# Patient Record
Sex: Male | Born: 1965 | Race: Black or African American | Hispanic: No | Marital: Single | Smoking: Former smoker
Health system: Southern US, Community
[De-identification: ages and names within clinical notes are randomized; demographics above are authoritative.]

## PROBLEM LIST (undated history)

## (undated) DIAGNOSIS — I10 Essential (primary) hypertension: Secondary | ICD-10-CM

## (undated) DIAGNOSIS — E785 Hyperlipidemia, unspecified: Secondary | ICD-10-CM

## (undated) HISTORY — DX: Hyperlipidemia, unspecified: E78.5

## (undated) HISTORY — DX: Essential (primary) hypertension: I10

## (undated) HISTORY — DX: Morbid (severe) obesity due to excess calories: E66.01

## (undated) HISTORY — PX: MASTECTOMY: SHX3

---

## 1998-02-01 ENCOUNTER — Emergency Department (HOSPITAL_COMMUNITY): Admission: EM | Admit: 1998-02-01 | Discharge: 1998-02-02 | Payer: Self-pay | Admitting: Emergency Medicine

## 2000-10-25 ENCOUNTER — Emergency Department (HOSPITAL_COMMUNITY): Admission: EM | Admit: 2000-10-25 | Discharge: 2000-10-25 | Payer: Self-pay | Admitting: Emergency Medicine

## 2000-10-25 ENCOUNTER — Encounter: Payer: Self-pay | Admitting: Emergency Medicine

## 2000-10-31 ENCOUNTER — Ambulatory Visit (HOSPITAL_COMMUNITY): Admission: RE | Admit: 2000-10-31 | Discharge: 2000-10-31 | Payer: Self-pay | Admitting: Orthopedic Surgery

## 2000-10-31 ENCOUNTER — Encounter: Payer: Self-pay | Admitting: Orthopedic Surgery

## 2002-08-07 ENCOUNTER — Emergency Department (HOSPITAL_COMMUNITY): Admission: EM | Admit: 2002-08-07 | Discharge: 2002-08-07 | Payer: Self-pay | Admitting: Emergency Medicine

## 2005-06-13 ENCOUNTER — Emergency Department (HOSPITAL_COMMUNITY): Admission: EM | Admit: 2005-06-13 | Discharge: 2005-06-13 | Payer: Self-pay | Admitting: Emergency Medicine

## 2010-01-13 ENCOUNTER — Encounter: Payer: Self-pay | Admitting: Internal Medicine

## 2010-02-02 ENCOUNTER — Encounter: Payer: Self-pay | Admitting: Internal Medicine

## 2010-03-15 DIAGNOSIS — R9431 Abnormal electrocardiogram [ECG] [EKG]: Secondary | ICD-10-CM

## 2010-03-16 ENCOUNTER — Ambulatory Visit: Payer: Self-pay | Admitting: Internal Medicine

## 2010-03-16 ENCOUNTER — Encounter: Payer: Self-pay | Admitting: Internal Medicine

## 2010-03-16 ENCOUNTER — Telehealth (INDEPENDENT_AMBULATORY_CARE_PROVIDER_SITE_OTHER): Payer: Self-pay | Admitting: *Deleted

## 2010-03-16 DIAGNOSIS — R0609 Other forms of dyspnea: Secondary | ICD-10-CM

## 2010-03-16 DIAGNOSIS — R072 Precordial pain: Secondary | ICD-10-CM | POA: Insufficient documentation

## 2010-03-16 DIAGNOSIS — E785 Hyperlipidemia, unspecified: Secondary | ICD-10-CM

## 2010-03-16 DIAGNOSIS — R0989 Other specified symptoms and signs involving the circulatory and respiratory systems: Secondary | ICD-10-CM | POA: Insufficient documentation

## 2010-03-16 DIAGNOSIS — F17200 Nicotine dependence, unspecified, uncomplicated: Secondary | ICD-10-CM

## 2010-03-16 DIAGNOSIS — R0789 Other chest pain: Secondary | ICD-10-CM | POA: Insufficient documentation

## 2010-03-16 DIAGNOSIS — E783 Hyperchylomicronemia: Secondary | ICD-10-CM

## 2010-03-16 DIAGNOSIS — I10 Essential (primary) hypertension: Secondary | ICD-10-CM

## 2010-04-13 ENCOUNTER — Ambulatory Visit: Payer: Self-pay | Admitting: Internal Medicine

## 2010-04-14 ENCOUNTER — Encounter: Payer: Self-pay | Admitting: Internal Medicine

## 2010-06-14 NOTE — Letter (Signed)
Summary: [facility]   Imported By: Marylou Mccoy 03/18/2010 09:29:58  _____________________________________________________________________  External Attachment:    Type:   Image     Comment:   External Document

## 2010-06-14 NOTE — Progress Notes (Signed)
  ROI faxed to [facility] @ [phone] [facility] @ [phone]. Charles Bush  March 16, 2010 1:41 PM      Appended Document:  Records received from [facility] gace to [place]   Appended Document:  Records received from [facility] gave to [place] ( covering [address])

## 2010-06-14 NOTE — Letter (Signed)
Summary: Generic Letter  [facility], Main Office  [address]   [place], [place] [address]   Phone: [phone]  Fax: [phone]        April 14, 2010 MRN: [id number]    Charles Bush [address] [place], [place]  [address]    Dear Charles Bush,  Our records indicate that you have cancelled your stress test.  It is Dr Bensimhon's recommendation that you have this test done.  Please contact our office as soon as possible to reschedule.     Sincerely,  Meredith Staggers, RN Arvilla Meres, MD  This letter has been electronically signed by your physician.

## 2010-06-14 NOTE — Assessment & Plan Note (Signed)
Summary: np6/abnormal ekg/mt   Visit Type:  new pt visit Referring Provider:  Dr. Fredderick Severance Primary Provider:  Dr. Fredderick Severance  CC:  pt here for abnormal EKG....sob pt states he is  heavy smoker....denies any edema or chest discomfort.  History of Present Illness: 45 y/o male (husband of [name], technical sales in [employer]) with morbid obesity, tobacco, HTN,  HL referred by PCP for abnormal ECG.   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 Takes elevator if has to go up steps. Denies chest pain but if he does a lot of activity may get some chest pressure. Moderate dyspnea. + occasional wheezing. No edema. Not exercising regularly.  Snores all night. Frequently stops breathing. Never been worked for sleep apnea.  PCP recently checked and was told levels were really high. Not started on anything. Denies diabetes or erectile dysfunction.  Smoking 1 PPD x 26 years.  Preventive Screening-Counseling & Management  Alcohol-Tobacco     Smoking Status: current  Caffeine-Diet-Exercise     Does Patient Exercise: no      Drug Use:  no.    Current Medications (verified): 1)  Amlodipine Besylate 5 Mg Tabs (Amlodipine Besylate) .Marland Kitchen.. 1 Tab Once Daily 2)  Chlorthalidone 25 Mg Tabs (Chlorthalidone) .Marland Kitchen.. 1 Tab Once Daily  Allergies (verified): No Known Drug Allergies  Past History:  Family History: Last updated: 03/16/2010 M:  h/o asthma,  s/p MI F: remission for blood cancer. no heart disease Sister: doing ok   Social History: Last updated: 03/16/2010 Full Time Married ..wife is Diplomatic Services operational officer @ [employer] Tobacco Use - Yes. 1 ppd Regular Exercise - no Drug Use - no  Risk Factors: Exercise: no (03/16/2010)  Risk Factors: Smoking Status: current (03/16/2010)  Past Medical History: Morbid Obesity HTN Hyperlipidemia Tobacco user  Past Surgical History: mastectomy  Family History: M:  h/o asthma,  s/p MI F: remission for blood cancer. no heart disease Sister: doing ok   Social  History: Full Time Married ..wife is Diplomatic Services operational officer @ [employer] Tobacco Use - Yes. 1 ppd Regular Exercise - no Drug Use - no Smoking Status:  current Does Patient Exercise:  no Drug Use:  no  Review of Systems       As per HPI and past medical history; otherwise all systems negative.   Vital Signs:  Patient profile:   44 year old male Height:      67 inches Weight:      264.8 pounds BMI:     41.62 Pulse rate:   68 / minute Pulse rhythm:   regular BP sitting:   126 / 74  (left arm) Cuff size:   large  Vitals Entered By: Danielle Rankin, CMA (March 16, 2010 9:46 AM)  Physical Exam  General:  Well appearing. no resp difficulty HEENT: normal except for poor dentition Neck: supple. no JVD. Carotids 2+ bilat; no bruits. No lymphadenopathy or thryomegaly appreciated. Cor: PMI nondisplaced. Regular rate & rhythm. No rubs, gallops, murmur. Lungs: clear Abdomen: obese soft, nontender, nondistended. No hepatosplenomegaly. No bruits or masses. Good bowel sounds. Extremities: no cyanosis, clubbing, rash, edema Neuro: alert & orientedx3, cranial nerves grossly intact. moves all 4 extremities w/o difficulty. affect pleasant    Problems:  Medical Problems Added: 1)  Dx of Tobacco User  (ICD-305.1) 2)  Dx of Snoring  (ICD-786.09) 3)  Dx of Hypertension, Benign  (ICD-401.1) 4)  Dx of Hyperlipidemia Type I / Iv  (ICD-272.3) 5)  Dx of Obesity-morbid (>100')  (ICD-278.01) 6)  Dx  of Chest Tightness-pressure-other  (ICD-786.59) 7)  Dx of Chest Pain, Precordial  (ICD-786.51) 8)  Dx of Hyperlipidemia  (ICD-272.4) 9)  Dx of Dyspnea On Exertion  (ICD-786.09)  Impression & Recommendations:  Problem # 1:  CHEST TIGHTNESS-PRESSURE-OTHER (AOZ-308.65) He has multiple risk factors for CAD. We initially discussed enrollment into Promise Trial (stress test vs cardiac CT) but he does not qualify based on age. Will proceed with ETT. Start ECASA 81qd   Problem # 2:  HYPERTENSION, BENIGN (ICD-401.1) Blood  pressure well controlled. Continue current regimen.  Problem # 3:  HYPERLIPIDEMIA TYPE I / IV (ICD-272.3) Will get results from PCP and treat accordingly.  Problem # 4:  SNORING (ICD-786.09) Suspect he has severe OSA. Discuseed cardiac risks of this. Will start with attempts for diet and exercise.  Problem # 5:  TOBACCO USER (ICD-305.1) Counseled on need to quit. Will start Chantix.  Problem # 6:  OBESITY-MORBID (>100') (ICD-278.01) Have recommended he start on a walking progra, 20-40 mins 4-5x/week for starters. Also begin to focus on diet. Stressed to him that becoming more active is first focus and we can work ona ctual weight loss later.  Other Orders: Treadmill (Treadmill)  Patient Instructions: 1)  Chantix Starter Kit--take as directed 2)  Your physician discussed the risks, benefits and indications for preventive aspirin therapy. It is recommended that you start (or continue) taking 81 mg of aspirin a day. 3)  Your physician has requested that you have an exercise tolerance test.  For further information please visit [email or web address]  Please also follow instruction sheet, as given. 4)  Follow up in 3 months. Prescriptions: CHANTIX STARTING MONTH PAK 0.5 MG X 11 & 1 MG X 42 TABS (VARENICLINE TARTRATE) Take as directed  #1 pack x 0   Entered by:   Meredith Staggers, RN   Authorized by:   Dolores Patty, MD, [facility]   Signed by:   Meredith Staggers, RN on 03/16/2010   Method used:   [facility]  [address] [phone]* (retail)       [address]       [place], [place]  [address]       Ph: [phone] or [phone]       Fax: [phone]   RxID:   [id number]   Prevention & Chronic Care Immunizations   Influenza vaccine: Not documented    Tetanus booster: Not documented    Pneumococcal vaccine: Not documented  Other Screening   Smoking status: current  (03/16/2010)  Lipids   Total Cholesterol: Not documented   LDL: Not  documented   LDL Direct: Not documented   HDL: Not documented   Triglycerides: Not documented    SGOT (AST): Not documented   SGPT (ALT): Not documented   Alkaline phosphatase: Not documented   Total bilirubin: Not documented  Hypertension   Last Blood Pressure: 126 / 74  (03/16/2010)   Serum creatinine: Not documented   Serum potassium Not documented  Self-Management Support :    Hypertension self-management support: Not documented    Lipid self-management support: Not documented

## 2010-06-29 ENCOUNTER — Ambulatory Visit: Payer: Self-pay | Admitting: Internal Medicine

## 2010-07-21 ENCOUNTER — Encounter (INDEPENDENT_AMBULATORY_CARE_PROVIDER_SITE_OTHER): Payer: Self-pay | Admitting: *Deleted

## 2010-07-26 ENCOUNTER — Encounter: Payer: Self-pay | Admitting: Internal Medicine

## 2010-07-26 NOTE — Letter (Signed)
Summary: Appointment - Reschedule  [employer], Main Office  [address]   [place], [place] [address]   Phone: [phone]  Fax: [phone]     July 21, 2010 MRN: [id number]   Charles Bush [address] [place], [place]  [address]   Dear [name],   Due to a change in our office schedule, your appointment on  March 27,2012 at 10:45 must be changed.  It is very important that we reach you to reschedule this appointment. We look forward to participating in your health care needs. Please contact us at the number listed above at your earliest convenience to reschedule this appointment.     Sincerely, Control and instrumentation engineer

## 2010-08-09 ENCOUNTER — Ambulatory Visit: Payer: Self-pay | Admitting: Internal Medicine

## 2010-08-11 NOTE — Letter (Signed)
Summary: [facility] Office Visit Note   [facility] Visit Note   Imported By: Roderic Ovens 08/02/2010 10:46:04  _____________________________________________________________________  External Attachment:    Type:   Image     Comment:   External Document

## 2010-08-23 ENCOUNTER — Ambulatory Visit (INDEPENDENT_AMBULATORY_CARE_PROVIDER_SITE_OTHER): Payer: Self-pay | Admitting: Internal Medicine

## 2010-08-23 ENCOUNTER — Encounter: Payer: Self-pay | Admitting: Internal Medicine

## 2010-08-23 VITALS — BP 148/88 | HR 67 | Resp 18 | Ht 67.0 in | Wt 266.8 lb

## 2010-08-23 DIAGNOSIS — F17200 Nicotine dependence, unspecified, uncomplicated: Secondary | ICD-10-CM

## 2010-08-23 DIAGNOSIS — N529 Male erectile dysfunction, unspecified: Secondary | ICD-10-CM

## 2010-08-23 DIAGNOSIS — R0789 Other chest pain: Secondary | ICD-10-CM

## 2010-08-23 DIAGNOSIS — I10 Essential (primary) hypertension: Secondary | ICD-10-CM

## 2010-08-23 DIAGNOSIS — E785 Hyperlipidemia, unspecified: Secondary | ICD-10-CM

## 2010-08-23 MED ORDER — SILDENAFIL CITRATE 100 MG PO TABS: 100.0000 mg | ORAL_TABLET | Freq: Every day | ORAL | Status: AC | PRN

## 2010-08-23 MED ORDER — SPIRONOLACTONE 25 MG PO TABS: 25.0000 mg | ORAL_TABLET | Freq: Every day | ORAL | Status: DC

## 2010-08-23 MED ORDER — AMLODIPINE BESYLATE 5 MG PO TABS: 10.0000 mg | ORAL_TABLET | Freq: Every day | ORAL | Status: DC

## 2010-08-23 NOTE — Progress Notes (Signed)
HPI:  Charles Bush is 45 y/o male (husband of Charles Bush, technical sales in [employer]) with morbid obesity, ongoing tobacco use, HTN,  HL referred by PCP for abnormal ECG.   We saw him for the first time for the first time in November 2011. Recommended ETT, Chantix for smoking cessation and weight loss to help deal with probable OSA. Unable to tolerate Chantix due to side effects. Has not lost weight. Did not get ETT due to finances.   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 Takes elevator if has to go up steps. Denies any further chest pain. Moderate dyspnea. + occasional wheezing. No edema. Not exercising regularly.  Snores at night. Frequently stops breathing. Never been worked for sleep apnea.  PCP checked lipids last year and told it was really high. Not checked again.   ROS: All systems negative except as listed in HPI, PMH and Problem List.  Past Medical History  Diagnosis Date  . Morbid obesity   . HTN (hypertension)   . HLD (hyperlipidemia)     Current Outpatient Prescriptions  Medication Sig Dispense Refill  . amLODipine (NORVASC) 5 MG tablet Take 5 mg by mouth daily.       . chlorthalidone (HYGROTON) 25 MG tablet Take 25 mg by mouth daily.        . Varenicline Tartrate (CHANTIX PO) Take by mouth as directed.           PHYSICAL EXAM: Filed Vitals:   08/23/10 1046  BP: 148/88  Pulse: 67  Resp: 18   General:  Obese. Well appearing. No resp difficulty HEENT: normal Neck: supple. JVP hard to see. Carotids 2+ bilaterally; no bruits. No lymphadenopathy or thryomegaly appreciated. Cor: PMI normal. Regular rate & rhythm. No rubs, gallops or murmurs. Lungs: clear Abdomen: obese. soft, nontender, nondistended. No hepatosplenomegaly. No bruits or masses. Good bowel sounds. Extremities: no cyanosis, clubbing, rash, tr-1+ edema Neuro: alert & orientedx3, cranial nerves grossly intact. Moves all 4 extremities w/o difficulty. Affect pleasant.    ECG: NSR 69 No ST-T wave  abnormalities.     ASSESSMENT & PLAN:

## 2010-08-23 NOTE — Patient Instructions (Addendum)
Increase Amlodipine to 10mg  daily Start Spironolactone 25mg  daily Labs today Your physician recommends that you return for lab work in: 1 week (BMET 401.1) Your physician discussed the hazards of tobacco use. Tobacco use cessation is recommended and techniques and options to help you quit were discussed. Your physician recommends that you schedule a follow-up appointment in: 6 weeks with Dr Gala Romney

## 2010-08-27 DIAGNOSIS — N529 Male erectile dysfunction, unspecified: Secondary | ICD-10-CM | POA: Insufficient documentation

## 2010-08-27 NOTE — Assessment & Plan Note (Signed)
Given prescription for trial of PRN sildenafil.

## 2010-08-27 NOTE — Assessment & Plan Note (Signed)
Suboptimally controlled. Increase amlodipine to 10 qd and change chlorithalidone to spironolactone 12.5. Check labs in 1 week. F/u with PCP.

## 2010-08-27 NOTE — Assessment & Plan Note (Signed)
Counseled on need for diet and weight loss.

## 2010-08-27 NOTE — Assessment & Plan Note (Signed)
Resolved. Did not show for scheduled stress test. If CPf recurs, would have very low threshold for further testing.

## 2010-08-27 NOTE — Assessment & Plan Note (Signed)
Has not had lipids checked in a long time. Due for a recheck. Stressed need for diet and weight loss.

## 2010-08-27 NOTE — Assessment & Plan Note (Signed)
Counseled on need for smoking cessation.  

## 2010-08-30 ENCOUNTER — Other Ambulatory Visit (INDEPENDENT_AMBULATORY_CARE_PROVIDER_SITE_OTHER): Payer: Self-pay | Admitting: *Deleted

## 2010-08-30 DIAGNOSIS — I10 Essential (primary) hypertension: Secondary | ICD-10-CM

## 2010-08-30 LAB — BASIC METABOLIC PANEL
BUN: 22 mg/dL (ref 6–23)
GFR: 106.47 mL/min (ref >60.00)
Potassium: 4.5 mEq/L (ref 3.5–5.1)
Sodium: 140 mEq/L (ref 135–145)

## 2010-09-28 ENCOUNTER — Ambulatory Visit (INDEPENDENT_AMBULATORY_CARE_PROVIDER_SITE_OTHER): Payer: BC Managed Care – PPO | Admitting: Internal Medicine

## 2010-09-28 ENCOUNTER — Ambulatory Visit: Payer: Self-pay | Admitting: Internal Medicine

## 2010-09-28 ENCOUNTER — Encounter: Payer: Self-pay | Admitting: Internal Medicine

## 2010-09-28 VITALS — BP 150/88 | HR 60 | Resp 14 | Ht 67.0 in | Wt 272.0 lb

## 2010-09-28 DIAGNOSIS — R5383 Other fatigue: Secondary | ICD-10-CM

## 2010-09-28 DIAGNOSIS — E785 Hyperlipidemia, unspecified: Secondary | ICD-10-CM

## 2010-09-28 DIAGNOSIS — R0609 Other forms of dyspnea: Secondary | ICD-10-CM

## 2010-09-28 DIAGNOSIS — E781 Pure hyperglyceridemia: Secondary | ICD-10-CM

## 2010-09-28 DIAGNOSIS — R0683 Snoring: Secondary | ICD-10-CM

## 2010-09-28 DIAGNOSIS — I10 Essential (primary) hypertension: Secondary | ICD-10-CM

## 2010-09-28 LAB — LIPID PANEL
HDL: 37.9 mg/dL — ABNORMAL LOW (ref >39.00)
Total CHOL/HDL Ratio: 4

## 2010-09-28 LAB — HEPATIC FUNCTION PANEL
ALT: 16 U/L (ref 0–53)
AST: 16 U/L (ref 0–37)
Alkaline Phosphatase: 67 U/L (ref 39–117)
Bilirubin, Direct: 0.1 mg/dL (ref 0.0–0.3)
Total Bilirubin: 0.4 mg/dL (ref 0.3–1.2)

## 2010-09-28 MED ORDER — HYDROCHLOROTHIAZIDE 12.5 MG PO CAPS: 12.5000 mg | ORAL_CAPSULE | Freq: Every day | ORAL | Status: DC

## 2010-09-28 MED ORDER — LISINOPRIL 20 MG PO TABS: 20.0000 mg | ORAL_TABLET | Freq: Every day | ORAL | Status: DC

## 2010-09-28 NOTE — Assessment & Plan Note (Signed)
Continue to encourage exercise and weight loss 

## 2010-09-28 NOTE — Patient Instructions (Signed)
Start Lisinpril 20 mg daily Start Hydrochlorathiazide 12.5 mg daily You have been referred to Pulmonary for sleep eval Your physician recommends that you return for lab work in: 2 weeks (bmet 401.1) Your physician recommends that you schedule a follow-up appointment in: 1 month

## 2010-09-28 NOTE — Assessment & Plan Note (Signed)
Refer for sleep study

## 2010-09-28 NOTE — Assessment & Plan Note (Signed)
Checking lipids today.

## 2010-09-28 NOTE — Progress Notes (Signed)
HPI:  Charles Bush is 45 y/o male (husband of [name], technical sales in [employer]) with morbid obesity, ongoing tobacco use, HTN,  HL referred by PCP for abnormal ECG.   We saw him for the first time for the first time in November 2011. Recommended ETT, Chantix for smoking cessation and weight loss to help deal with probable OSA. Unable to tolerate Chantix due to side effects. Has not lost weight. Did not get ETT due to finances.   We saw him last month and BP was high. We increased Norvasc and changed chlorithalidone to spiro 12.5. Has not checked BP since. Feels SOB is some better. 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 Takes elevator if has to go up steps. No edema. Not exercising regularly.  Snores at night. Frequently stops breathing. Never been worked for sleep apnea. Weight up 5 pounds since last visit.   Lipids being checked today.    ROS: All systems negative except as listed in HPI, PMH and Problem List.  Past Medical History  Diagnosis Date  . Morbid obesity   . HTN (hypertension)   . HLD (hyperlipidemia)     Current Outpatient Prescriptions  Medication Sig Dispense Refill  . amLODipine (NORVASC) 5 MG tablet Take 2 tablets (10 mg total) by mouth daily.  30 tablet  6  . aspirin 81 MG tablet Take 81 mg by mouth daily.        Marland Kitchen spironolactone (ALDACTONE) 25 MG tablet Take 1 tablet (25 mg total) by mouth daily.  30 tablet  6  . DISCONTD: Varenicline Tartrate (CHANTIX PO) Take by mouth as directed.           PHYSICAL EXAM: Filed Vitals:   09/28/10 1023  BP: 150/88  Pulse: 60  Resp: 14   General:  Obese. Well appearing. No resp difficulty HEENT: normal Neck: supple. JVP hard to see. Carotids 2+ bilaterally; no bruits. No lymphadenopathy or thryomegaly appreciated. Cor: PMI normal. Regular rate & rhythm. No rubs, gallops or murmurs. Lungs: clear Abdomen: obese. soft, nontender, nondistended. No hepatosplenomegaly. No bruits or masses. Good bowel sounds. Extremities:  no cyanosis, clubbing, rash, tr-1+ edema Neuro: alert & orientedx3, cranial nerves grossly intact. Moves all 4 extremities w/o difficulty. Affect pleasant.      ASSESSMENT & PLAN:

## 2010-09-28 NOTE — Assessment & Plan Note (Signed)
BP still elevated. Add lisinopril/HCTZ 20/12.5. Refer for sleep study.

## 2010-09-30 NOTE — Op Note (Signed)
[facility]  Patient:    Charles Bush, Charles Bush                      MRN: [id number] Proc. Date: 10/31/00 Adm. Date:  [id number] Attending:  Ollen Gross V                           Operative Report  PREOPERATIVE DIAGNOSES:  Right fourth and fifth metacarpal fracture.  POSTOPERATIVE DIAGNOSES:  Right fourth and fifth metacarpal fracture.  PROCEDURE:  Percutaneous pinning, right fifth metacarpal fracture with closed reduction, fourth metacarpal fracture.  SURGEON:  Dr. Lequita Halt.  ASSISTANT:  Cherly Beach, P.A.-C.  ANESTHESIA:  General.  ESTIMATED BLOOD LOSS:  Minimal.  DRAINS:  None.  COMPLICATIONS:  None.  CONDITION:  Stable to recovery.  BRIEF CLINICAL NOTE:  [name] is a 45 year old male who sustained fourth and fifth metacarpal fractures approximately four nights ago. He punched a wall and a comminuted fracture at the base of the fifth as well as a shaft fracture on the fourth. He presents now for fixation, percutaneous pinning versus open reduction and internal fixation.  DESCRIPTION OF PROCEDURE:  After successful administration of general anesthesia, a tourniquet right upper extremity prepped and draped in the usual sterile fashion. Under fluoroscopic guidance, traction was applied to the small finger and longitudinal plane which disimpacted the base fracture and led to appropriate length and reduction. A 0.45 K wire was then fired transverse across the base of the condyles to effectively reduce them to the fourth metacarpal. There was a split fracture between the two condyles and this effectively reduced it. Another pin was then placed from the ulnar base of the fifth across the fracture site into the radial side of the shaft of the fifth metacarpal. This held it in place and a third pin is placed through the base of the [facility] joint at the metacarpal end and into the shaft of the fifth metacarpal. The combination of these three pins led to  stable reduction and appropriate restoration of length and angulation. The fourth metacarpal had minimal angulation and just with some upwards pressure on the metacarpal head, this reduced and reduction is maintained. The pins are then cut, bent, and caps placed. Bulky sterile dressings applied and well molded to maintain reduction of fourth metacarpal. The patient is then awakened and transported to the recovery room in stable condition. DD:  10/31/00 TD:  11/01/00 Job: [id number]

## 2010-10-14 ENCOUNTER — Institutional Professional Consult (permissible substitution): Payer: BC Managed Care – PPO | Admitting: Pulmonary Disease

## 2010-10-24 ENCOUNTER — Telehealth: Payer: Self-pay | Admitting: Internal Medicine

## 2010-10-24 NOTE — Telephone Encounter (Signed)
Left message to call back  

## 2010-10-24 NOTE — Telephone Encounter (Signed)
Test results

## 2010-10-25 NOTE — Telephone Encounter (Signed)
Test results

## 2010-10-25 NOTE — Telephone Encounter (Signed)
Left a message to call back.

## 2010-10-26 MED ORDER — LOVASTATIN 20 MG PO TABS: 20.0000 mg | ORAL_TABLET | Freq: Every day | ORAL | Status: DC

## 2010-10-26 NOTE — Telephone Encounter (Signed)
Pt's wife given lab results, [facility]

## 2010-11-07 ENCOUNTER — Ambulatory Visit: Payer: BC Managed Care – PPO | Admitting: Internal Medicine

## 2010-11-18 ENCOUNTER — Encounter: Payer: Self-pay | Admitting: Internal Medicine

## 2011-08-12 ENCOUNTER — Other Ambulatory Visit: Payer: Self-pay

## 2011-08-12 ENCOUNTER — Emergency Department (HOSPITAL_COMMUNITY)
Admission: EM | Admit: 2011-08-12 | Discharge: 2011-08-12 | Disposition: A | Discharge status: Home / Self Care | Payer: BC Managed Care – PPO | Attending: Emergency Medicine | Admitting: Emergency Medicine

## 2011-08-12 ENCOUNTER — Emergency Department (HOSPITAL_COMMUNITY): Payer: BC Managed Care – PPO

## 2011-08-12 ENCOUNTER — Encounter (HOSPITAL_COMMUNITY): Payer: Self-pay | Admitting: Emergency Medicine

## 2011-08-12 DIAGNOSIS — Z7982 Long term (current) use of aspirin: Secondary | ICD-10-CM | POA: Insufficient documentation

## 2011-08-12 DIAGNOSIS — F19929 Other psychoactive substance use, unspecified with intoxication, unspecified: Secondary | ICD-10-CM

## 2011-08-12 DIAGNOSIS — Z79899 Other long term (current) drug therapy: Secondary | ICD-10-CM | POA: Insufficient documentation

## 2011-08-12 DIAGNOSIS — E785 Hyperlipidemia, unspecified: Secondary | ICD-10-CM | POA: Insufficient documentation

## 2011-08-12 DIAGNOSIS — F19988 Other psychoactive substance use, unspecified with other psychoactive substance-induced disorder: Secondary | ICD-10-CM | POA: Insufficient documentation

## 2011-08-12 DIAGNOSIS — F17200 Nicotine dependence, unspecified, uncomplicated: Secondary | ICD-10-CM | POA: Insufficient documentation

## 2011-08-12 DIAGNOSIS — R4182 Altered mental status, unspecified: Secondary | ICD-10-CM

## 2011-08-12 DIAGNOSIS — I10 Essential (primary) hypertension: Secondary | ICD-10-CM | POA: Insufficient documentation

## 2011-08-12 LAB — URINALYSIS, ROUTINE W REFLEX MICROSCOPIC
Ketones, ur: NEGATIVE mg/dL
Leukocytes, UA: NEGATIVE
Nitrite: NEGATIVE
Specific Gravity, Urine: 1.025 (ref 1.005–1.030)
Urobilinogen, UA: 1 mg/dL (ref 0.0–1.0)
pH: 5.5 (ref 5.0–8.0)

## 2011-08-12 LAB — APTT: aPTT: 26 seconds (ref 24–37)

## 2011-08-12 LAB — COMPREHENSIVE METABOLIC PANEL
ALT: 14 U/L (ref 0–53)
AST: 18 U/L (ref 0–37)
Alkaline Phosphatase: 79 U/L (ref 39–117)
CO2: 25 mEq/L (ref 19–32)
Chloride: 101 mEq/L (ref 96–112)
GFR calc Af Amer: >90 mL/min (ref >90)
GFR calc non Af Amer: 86 mL/min — ABNORMAL LOW (ref >90)
Glucose, Bld: 147 mg/dL — ABNORMAL HIGH (ref 70–99)
Potassium: 4.1 mEq/L (ref 3.5–5.1)
Sodium: 137 mEq/L (ref 135–145)

## 2011-08-12 LAB — ETHANOL: Alcohol, Ethyl (B): <11 mg/dL (ref 0–11)

## 2011-08-12 LAB — CBC
MCV: 92.8 fL (ref 78.0–100.0)
Platelets: 325 10*3/uL (ref 150–400)
RBC: 4.43 MIL/uL (ref 4.22–5.81)
WBC: 17.2 10*3/uL — ABNORMAL HIGH (ref 4.0–10.5)

## 2011-08-12 LAB — SALICYLATE LEVEL: Salicylate Lvl: <2 mg/dL — ABNORMAL LOW (ref 2.8–20.0)

## 2011-08-12 LAB — RAPID URINE DRUG SCREEN, HOSP PERFORMED: Barbiturates: NOT DETECTED

## 2011-08-12 LAB — URINE MICROSCOPIC-ADD ON

## 2011-08-12 LAB — DIFFERENTIAL
Lymphocytes Relative: 8 % — ABNORMAL LOW (ref 12–46)
Lymphs Abs: 1.3 10*3/uL (ref 0.7–4.0)
Neutro Abs: 14.8 10*3/uL — ABNORMAL HIGH (ref 1.7–7.7)
Neutrophils Relative %: 87 % — ABNORMAL HIGH (ref 43–77)

## 2011-08-12 LAB — AMMONIA: Ammonia: 32 umol/L (ref 11–60)

## 2011-08-12 LAB — PROTIME-INR: Prothrombin Time: 13.1 seconds (ref 11.6–15.2)

## 2011-08-12 MED ORDER — SODIUM CHLORIDE 0.9 % IV SOLN: INTRAVENOUS | Status: DC

## 2011-08-12 MED ORDER — NALOXONE HCL 0.4 MG/ML IJ SOLN
INTRAMUSCULAR | Status: AC
  Administered 2011-08-12: 03:00:00
  Filled 2011-08-12: qty 1

## 2011-08-12 NOTE — ED Notes (Signed)
Pt wife contacted and sts that she will be here in 45 min

## 2011-08-12 NOTE — ED Notes (Signed)
Patient found in vehicle by [employer] unresponsive.  Patient awakes with use of ammonia inhalant, is alert to name and place.  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

## 2011-08-12 NOTE — ED Notes (Signed)
Awake alert and talking at this time he does not recall how he arrived here and he has questions in regard to his belongings repeatedly. [employer] in to talk with the patient.

## 2011-08-12 NOTE — ED Notes (Signed)
Resting quietly with eyes closed.

## 2011-08-12 NOTE — Discharge Instructions (Signed)
Substance Abuse Your exam indicates that you have a problem with substance abuse. Substance abuse is the misuse of alcohol or drugs that causes problems in family life, friendships, and work relationships. Substance abuse is the most important cause of premature illness, disability, and death in our society. It is also the greatest threat to a person's mental and spiritual well being. Substance abuse can start out in an innocent way, such as social drinking or taking a little extra medication prescribed by your doctor. No one starts out with the intention of becoming an alcoholic or an addict. Substance abuse victims cannot control their use of alcohol or drugs. They may become intoxicated daily or go on weekend binges. Often there is a strong desire to quit, but attempts to stop using often fail. Encounters with law enforcement or conflicts with family members, friends, and work associates are signs of a potential problem. Recovery is always possible, although the craving for some drugs makes it difficult to quit without assistance. Many treatment programs are available to help people stop abusing alcohol or drugs. The first step in treatment is to admit you have a problem. This is a major hurdle because denial is a powerful force with substance abuse. Alcoholics Anonymous, Narcotics Anonymous, Cocaine Anonymous, and other recovery groups and programs can be very useful in helping people to quit. If you do not feel okay about your drug or alcohol use and if it is causing you trouble, we want to encourage you to talk about it with your doctor or with someone from a recovery group who can help you. You could also call the [employer] on Drug Abuse at 1-800-662-HELP. It is up to you to take the first step. AL-ANON and ALA-TEEN are support groups for friends and family members of an alcohol or drug dependent person. The people who love and care for the alcoholic or addicted person often need help, too. For  information about these organizations, check your phone directory or call a local alcohol or drug treatment center. Document Released: 06/08/2004 Document Revised: 04/20/2011 Document Reviewed: 05/02/2008 [employer] Patient Information 2012 [place], [place].  RESOURCE GUIDE  Dental Problems  Patients with Medicaid: [facility] 867-652-[address]                                           [phone] [address] Phone:  [phone]                                                   Phone:  [phone]  If unable to pay or uninsured, contact:  [facility] or [facility]. to become qualified for the adult dental clinic.  Chronic Pain Problems Contact [facility]  [phone] Patients need to be referred by their primary care doctor.  Insufficient Money for Medicine Contact United Way:  call "211" or [facility] 623-882-9700.  No Primary Care Doctor Call [facility]  8785152703 Other agencies that provide inexpensive medical care    [facility]  [phone]    [facility] Internal Medicine  [phone]    [facility]  [phone]    [facility]  [phone]    [facility]  414-140-8111    [facility]  262-863-9506  Psychological Services [facility]  [phone] [facility]  [phone] [facility] Mental Health   [phone] (emergency services [phone])  Abuse/Neglect [facility] Child Abuse Hotline 562-376-0236 [facility] Child Abuse Hotline 407 330 4581 (After Hours)  Emergency Shelter [facility] 928-102-6364  Maternity Homes [facility] [place] [facility] 9148310136 [facility] [phone]  MRSA Hotline #:   908-538-8903    [employer] Resources  [facility]  United [employer]                           [facility]. [address] [place]                      [address]         [address] [place] [address]  Blondell Reveal Phone:  [phone]                                  Phone:  [phone]                   Phone:  [phone]  [facility] Phone:  [phone]  [facility] Child Abuse Hotline 412-195-7936 [phone] (After Hours)

## 2011-08-12 NOTE — ED Provider Notes (Signed)
History     CSN: [id number]  Arrival date & time 08/12/11  0245   First MD Initiated Contact with Patient 08/12/11 [phone]      Chief Complaint  Patient presents with  . Altered Mental Status     Patient is a 46 y.o. male presenting with altered mental status. The history is provided by the patient, the EMS personnel and the police. History Limited By: AMS.  Altered Mental Status  Pt was seen at 0355.  Per EMS, Police and pt report, pt found "unresponsive" sitting in his vehicle by [employer] P[employer].  Upon EMS arrival to scene, pt woke up with the use of an ammonia inhalant.  Remained awake/alert during transport.  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  Police state they found paraphernalia of same in his vehicle.  Pt denies any complaints currently, endorses he is "just tired."  Denies SI/SA, no CP/SOB, no abd pain, no headache, no back pain.   Past Medical History  Diagnosis Date  . Morbid obesity   . HTN (hypertension)   . HLD (hyperlipidemia)     Past Surgical History  Procedure Date  . Mastectomy     Family History  Problem Relation Age of Onset  . Asthma Mother   . Heart attack Mother   . Cancer Father     blood    History  Substance Use Topics  . Smoking status: Current Everyday Smoker -- 1.0 packs/day    Types: Cigarettes  . Smokeless tobacco: Not on file  . Alcohol Use: Yes      Review of Systems  Unable to perform ROS: Other  Psychiatric/Behavioral: Positive for altered mental status.    Allergies  Review of patient's allergies indicates no known allergies.  Home Medications   Current Outpatient Rx  Name Route Sig Dispense Refill  . AMLODIPINE BESYLATE 5 MG PO TABS Oral Take 2 tablets (10 mg total) by mouth daily. 30 tablet 6  . ASPIRIN 81 MG PO TABS Oral Take 81 mg by mouth daily.      Marland Kitchen HYDROCHLOROTHIAZIDE 12.5 MG PO CAPS Oral Take 1 capsule (12.5 mg total) by mouth daily. 30 capsule 6  . LISINOPRIL 20 MG PO TABS  Oral Take 1 tablet (20 mg total) by mouth daily. 30 tablet 6  . LOVASTATIN 20 MG PO TABS Oral Take 1 tablet (20 mg total) by mouth at bedtime. 30 tablet 6  . SPIRONOLACTONE 25 MG PO TABS Oral Take 1 tablet (25 mg total) by mouth daily. 30 tablet 6    BP 155/74  Pulse 94  Temp(Src) 98.2 F (36.8 C) (Oral)  Resp 18  SpO2 95%  Physical Exam 0400: Physical examination:  Nursing notes reviewed; Vital signs and O2 SAT reviewed;  Constitutional: Well developed, Well nourished, Well hydrated, In no acute distress; Head:  Normocephalic, atraumatic; Eyes: EOMI, PERRL, No scleral icterus; ENMT: Mouth and pharynx normal, Mucous membranes moist; Neck: Supple, Full range of motion, No lymphadenopathy; Cardiovascular: Regular rate and rhythm, No murmur, rub, or gallop; Respiratory: Breath sounds clear & equal bilaterally, No rales, rhonchi, wheezes, or rub, Normal respiratory effort/excursion; Chest: Nontender, Movement normal; Abdomen: Soft, Nontender, Nondistended, Normal bowel sounds; Extremities: Pulses normal, No tenderness, No edema, No calf edema or asymmetry.; Neuro: Lethargic, laying eyes closed, but awakens to his name, speech clear, no facial droop, moves all ext spontaneously on stretcher.; Skin: Color normal, Warm, Dry, no rash.    ED Course  Procedures  MDM  MDM Reviewed: nursing note and vitals Reviewed previous: ECG Interpretation: ECG, labs, x-ray and CT scan    Date: 08/12/2011  Rate: 91  Rhythm: normal sinus rhythm  QRS Axis: normal  Intervals: normal  ST/T Wave abnormalities: nonspecific ST/T changes  Conduction Disutrbances:nonspecific intraventricular conduction delay  Narrative Interpretation:   Old EKG Reviewed: unchanged; no significant changes from previous EKG dated 10/30/2000.  Results for orders placed during the hospital encounter of 08/12/11  ACETAMINOPHEN LEVEL      Component Value Range   Acetaminophen (Tylenol), Serum <15.0  10 - 30 (ug/mL)  SALICYLATE  LEVEL      Component Value Range   Salicylate Lvl <2.0 (*) 2.8 - 20.0 (mg/dL)  CBC      Component Value Range   WBC 17.2 (*) 4.0 - 10.5 (K/uL)   RBC 4.43  4.22 - 5.81 (MIL/uL)   Hemoglobin 13.8  13.0 - 17.0 (g/dL)   HCT 47.8  29.5 - 62.1 (%)   MCV 92.8  78.0 - 100.0 (fL)   MCH 31.2  26.0 - 34.0 (pg)   MCHC 33.6  30.0 - 36.0 (g/dL)   RDW 30.8 (*) 65.7 - 15.5 (%)   Platelets 325  150 - 400 (K/uL)  DIFFERENTIAL      Component Value Range   Neutrophils Relative 87 (*) 43 - 77 (%)   Neutro Abs 14.8 (*) 1.7 - 7.7 (K/uL)   Lymphocytes Relative 8 (*) 12 - 46 (%)   Lymphs Abs 1.3  0.7 - 4.0 (K/uL)   Monocytes Relative 6  3 - 12 (%)   Monocytes Absolute 1.0  0.1 - 1.0 (K/uL)   Eosinophils Relative 0  0 - 5 (%)   Eosinophils Absolute 0.0  0.0 - 0.7 (K/uL)   Basophils Relative 0  0 - 1 (%)   Basophils Absolute 0.0  0.0 - 0.1 (K/uL)  COMPREHENSIVE METABOLIC PANEL      Component Value Range   Sodium 137  135 - 145 (mEq/L)   Potassium 4.1  3.5 - 5.1 (mEq/L)   Chloride 101  96 - 112 (mEq/L)   CO2 25  19 - 32 (mEq/L)   Glucose, Bld 147 (*) 70 - 99 (mg/dL)   BUN 12  6 - 23 (mg/dL)   Creatinine, Ser 8.46  0.50 - 1.35 (mg/dL)   Calcium 9.2  8.4 - 96.2 (mg/dL)   Total Protein 7.2  6.0 - 8.3 (g/dL)   Albumin 3.8  3.5 - 5.2 (g/dL)   AST 18  0 - 37 (U/L)   ALT 14  0 - 53 (U/L)   Alkaline Phosphatase 79  39 - 117 (U/L)   Total Bilirubin 0.2 (*) 0.3 - 1.2 (mg/dL)   GFR calc non Af Amer 86 (*) >90 (mL/min)   GFR calc Af Amer >90  >90 (mL/min)  URINE RAPID DRUG SCREEN (HOSP PERFORMED)      Component Value Range   Opiates NONE DETECTED  NONE DETECTED    Cocaine POSITIVE (*) NONE DETECTED    Benzodiazepines NONE DETECTED  NONE DETECTED    Amphetamines NONE DETECTED  NONE DETECTED    Tetrahydrocannabinol POSITIVE (*) NONE DETECTED    Barbiturates NONE DETECTED  NONE DETECTED   ETHANOL      Component Value Range   Alcohol, Ethyl (B) <11  0 - 11 (mg/dL)  URINALYSIS, ROUTINE W REFLEX MICROSCOPIC       Component Value Range   Color, Urine YELLOW  YELLOW  APPearance CLEAR  CLEAR    Specific Gravity, Urine 1.025  1.005 - 1.030    pH 5.5  5.0 - 8.0    Glucose, UA NEGATIVE  NEGATIVE (mg/dL)   Hgb urine dipstick NEGATIVE  NEGATIVE    Bilirubin Urine NEGATIVE  NEGATIVE    Ketones, ur NEGATIVE  NEGATIVE (mg/dL)   Protein, ur 30 (*) NEGATIVE (mg/dL)   Urobilinogen, UA 1.0  0.0 - 1.0 (mg/dL)   Nitrite NEGATIVE  NEGATIVE    Leukocytes, UA NEGATIVE  NEGATIVE   PROTIME-INR      Component Value Range   Prothrombin Time 13.1  11.6 - 15.2 (seconds)   INR 0.97  0.00 - 1.49   APTT      Component Value Range   aPTT 26  24 - 37 (seconds)  AMMONIA      Component Value Range   Ammonia 32  11 - 60 (umol/L)  URINE MICROSCOPIC-ADD ON      Component Value Range   Squamous Epithelial / LPF RARE  RARE    WBC, UA 3-6  <3 (WBC/hpf)   RBC / HPF 0-2  <3 (RBC/hpf)   Bacteria, UA RARE  RARE    Casts HYALINE CASTS (*) NEGATIVE    Urine-Other MUCOUS PRESENT     Dg Chest 2 View 08/12/2011  *RADIOLOGY REPORT*  Clinical Data: Crack contain overdose.  The patient, and conscious. Smoker.  CHEST - 2 VIEW  Comparison: 06/13/2005  Findings: Shallow inspiration.  Prominent heart size and pulmonary vascularity which might suggest congestive failure or fluid overload.  Interval development since previous study of linear atelectasis or infiltration in the right lung base.  No blunting of costophrenic angles.  No pneumothorax.  Mild degenerative changes in the thoracic spine.  IMPRESSION: Cardiac enlargement with prominent pulmonary vascularity.  No edema.  Linear atelectasis or infiltration in the right lung base.  Original Report Authenticated By: Marlon Pel, M.D.   Ct Head Wo Contrast 08/12/2011  *RADIOLOGY REPORT*  Clinical Data: Altered mental status.  The patient found mostly in the scar.  Not easily arousable.  CT HEAD WITHOUT CONTRAST  Technique:  Contiguous axial images were obtained from the base of  the skull through the vertex without contrast.  Comparison: None.  Findings: Technically limited examination due to motion artifact. Given this limitation, the ventricles and sulci appear symmetrical. No mass effect or midline shift.  No abnormal extra-axial fluid collections.  The gray-white matter junctions appear distinct. Basal cisterns are not effaced.  No ventricular dilatation.  No evidence of acute intracranial hemorrhage.  Visualized paranasal sinuses and mastoid air cells are not opacified.  No depressed skull fractures.  IMPRESSION: The no acute intracranial abnormalities identified.  Original Report Authenticated By: Marlon Pel, M.D.     0730:  Pt sleeping most of ED visit, has remained easily arousable to name, resps easy, VSS.  Denies any complaints when asked.  Doubt more insidious process than drug/etoh abuse at this time.  Pt to remain in ED until able to demonstrate sobriety and family can come pick him up.  Sign out to Dr. Hyman Hopes.            Laray Anger, DO 08/14/11 [phone]

## 2011-08-12 NOTE — ED Notes (Signed)
Patient found in vehicle, unresponsive by [employer].  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 Patient admits to [facility], crack use and alcohol use.

## 2011-08-12 NOTE — ED Notes (Signed)
Per pt's request, threw shirt in trash, it had been cut up. No pockets or valuables in shirt.

## 2011-08-12 NOTE — ED Provider Notes (Signed)
BP 161/87  Pulse 79  Temp(Src) 98.2 F (36.8 C) (Oral)  Resp 17  SpO2 97%   Pt more awake than previous. Eating. Wife to pick up.  Forbes Cellar, MD 08/12/11 [phone]

## 2011-08-13 LAB — URINE CULTURE: Culture  Setup Time: 201303301113

## 2012-04-02 ENCOUNTER — Other Ambulatory Visit: Payer: Self-pay | Admitting: *Deleted

## 2012-04-02 MED ORDER — LOVASTATIN 20 MG PO TABS: 20.0000 mg | ORAL_TABLET | Freq: Every day | ORAL | Status: AC

## 2013-04-26 ENCOUNTER — Encounter (HOSPITAL_COMMUNITY): Payer: Self-pay | Admitting: Emergency Medicine

## 2013-04-26 ENCOUNTER — Emergency Department (HOSPITAL_COMMUNITY)
Admission: EM | Admit: 2013-04-26 | Discharge: 2013-04-26 | Disposition: A | Discharge status: Home / Self Care | Payer: BC Managed Care – PPO | Attending: Emergency Medicine | Admitting: Emergency Medicine

## 2013-04-26 ENCOUNTER — Emergency Department (HOSPITAL_COMMUNITY)
Admission: EM | Admit: 2013-04-26 | Discharge: 2013-04-26 | Disposition: A | Discharge status: Nursing Home (Includes ICF) | Payer: BC Managed Care – PPO | Source: Home / Self Care

## 2013-04-26 DIAGNOSIS — I10 Essential (primary) hypertension: Secondary | ICD-10-CM | POA: Insufficient documentation

## 2013-04-26 DIAGNOSIS — E785 Hyperlipidemia, unspecified: Secondary | ICD-10-CM | POA: Insufficient documentation

## 2013-04-26 DIAGNOSIS — F17200 Nicotine dependence, unspecified, uncomplicated: Secondary | ICD-10-CM

## 2013-04-26 DIAGNOSIS — Z79899 Other long term (current) drug therapy: Secondary | ICD-10-CM | POA: Insufficient documentation

## 2013-04-26 LAB — BASIC METABOLIC PANEL
BUN: 15 mg/dL (ref 6–23)
CO2: 27 mEq/L (ref 19–32)
GFR calc non Af Amer: >90 mL/min (ref >90)
Glucose, Bld: 89 mg/dL (ref 70–99)
Potassium: 4.1 mEq/L (ref 3.5–5.1)
Sodium: 139 mEq/L (ref 135–145)

## 2013-04-26 LAB — POCT I-STAT TROPONIN I

## 2013-04-26 MED ORDER — LISINOPRIL 20 MG PO TABS: 20.0000 mg | ORAL_TABLET | Freq: Every day | ORAL | Status: DC

## 2013-04-26 MED ORDER — HYDROCHLOROTHIAZIDE 12.5 MG PO CAPS: 12.5000 mg | ORAL_CAPSULE | Freq: Every day | ORAL | Status: DC

## 2013-04-26 NOTE — ED Notes (Signed)
MD at bedside. 

## 2013-04-26 NOTE — ED Notes (Addendum)
Pt was adv to f/u here due to HTN... He went to donate blood yest and BP was high BP today is 229/106 Was taking BP meds but has not had some 86yr < Smokes 1 PPD and has hx high cholesterol  Denies: HA, CP, SOB, nauseas, diaphoresis, weakness Pt is alert and talking in complete sentences w/no signs of acute distress.

## 2013-04-26 NOTE — ED Provider Notes (Addendum)
CSN: [id number]     Arrival date & time 04/26/13  1114 History   None    Chief Complaint  Patient presents with  . Hypertension   (Consider location/radiation/quality/duration/timing/severity/associated sxs/prior Treatment) Patient is a 47 y.o. male presenting with hypertension. The history is provided by the patient.  Hypertension This is a chronic problem. The current episode started yesterday (no meds for long time, last seen by cardiologist, , found yest ro have v high bp , told to go to hosp for eval.). Pertinent negatives include no chest pain, no abdominal pain and no shortness of breath.    Past Medical History  Diagnosis Date  . Morbid obesity   . HTN (hypertension)   . HLD (hyperlipidemia)    Past Surgical History  Procedure Laterality Date  . Mastectomy     Family History  Problem Relation Age of Onset  . Asthma Mother   . Heart attack Mother   . Cancer Father     blood   History  Substance Use Topics  . Smoking status: Current Every Day Smoker -- 1.00 packs/day    Types: Cigarettes  . Smokeless tobacco: Not on file  . Alcohol Use: Yes    Review of Systems  Constitutional: Negative.   Respiratory: Negative for chest tightness and shortness of breath.   Cardiovascular: Negative for chest pain, palpitations and leg swelling.  Gastrointestinal: Negative for abdominal pain.    Allergies  Review of patient's allergies indicates no known allergies.  Home Medications   Current Outpatient Rx  Name  Route  Sig  Dispense  Refill  . amLODipine (NORVASC) 5 MG tablet   Oral   Take 2 tablets (10 mg total) by mouth daily.   30 tablet   6   . aspirin 81 MG tablet   Oral   Take 81 mg by mouth daily.           Marland Kitchen EXPIRED: hydrochlorothiazide (,MICROZIDE/HYDRODIURIL,) 12.5 MG capsule   Oral   Take 1 capsule (12.5 mg total) by mouth daily.   30 capsule   6   . EXPIRED: lisinopril (PRINIVIL,ZESTRIL) 20 MG tablet   Oral   Take 1 tablet (20 mg total) by  mouth daily.   30 tablet   6   . EXPIRED: lovastatin (MEVACOR) 20 MG tablet   Oral   Take 1 tablet (20 mg total) by mouth at bedtime.   30 tablet   6   . EXPIRED: spironolactone (ALDACTONE) 25 MG tablet   Oral   Take 1 tablet (25 mg total) by mouth daily.   30 tablet   6    BP 189/99  Pulse 64  Temp(Src) 98.1 F (36.7 C) (Oral)  Resp 18  SpO2 99% Physical Exam  Nursing note and vitals reviewed. Constitutional: He is oriented to person, place, and time. He appears well-developed and well-nourished.  HENT:  Mouth/Throat: Oropharynx is clear and moist.  Neck: Normal range of motion. Neck supple.  Cardiovascular: Normal rate, regular rhythm, normal heart sounds and intact distal pulses.   Pulmonary/Chest: Effort normal and breath sounds normal.  Abdominal: Soft. Bowel sounds are normal.  Lymphadenopathy:    He has no cervical adenopathy.  Neurological: He is alert and oriented to person, place, and time.  Skin: Skin is warm and dry.    ED Course  Procedures (including critical care time) Labs Review Labs Reviewed - No data to display Imaging Review No results found.  EKG Interpretation    Date/Time:  Ventricular Rate:    PR Interval:    QRS Duration:   QT Interval:    QTC Calculation:   R Axis:     Text Interpretation:              MDM  Sent for mult cardiac risk factors-bm, smoker, hbp, hyperlipid, abnl ecg., no bp meds for long time.    Linna Hoff, MD 04/26/13 1157  Linna Hoff, MD 04/26/13 [phone]

## 2013-04-26 NOTE — ED Notes (Signed)
Report given to [facility]. 

## 2013-04-26 NOTE — ED Provider Notes (Signed)
CSN: [id number]     Arrival date & time 04/26/13  1213 History   First MD Initiated Contact with Patient 04/26/13 1214     Chief Complaint  Patient presents with  . Hypertension   (Consider location/radiation/quality/duration/timing/severity/associated sxs/prior Treatment) HPI  This is a 47 year old male with a history of hypertension who presents from urgent care with high blood pressure and EKG changes. Patient presented earlier today to urgent care for high blood pressure. He states that he tried to give blood yesterday and told that his blood pressure was "in stroke range."  Patient has been off his medications for approximately one year. He denies any chest pain, shortness of breath, headache. He has a history of hyperlipidemia and is a current smoker. No early family history of heart disease. At urgent care, patient was thought to have EKG changes were concerning and was at her for further evaluation.   Past Medical History  Diagnosis Date  . Morbid obesity   . HTN (hypertension)   . HLD (hyperlipidemia)    Past Surgical History  Procedure Laterality Date  . Mastectomy     Family History  Problem Relation Age of Onset  . Asthma Mother   . Heart attack Mother   . Cancer Father     blood   History  Substance Use Topics  . Smoking status: Current Every Day Smoker -- 1.00 packs/day    Types: Cigarettes  . Smokeless tobacco: Not on file  . Alcohol Use: Yes    Review of Systems  Constitutional: Negative.  Negative for fever.  Respiratory: Negative.  Negative for chest tightness and shortness of breath.   Cardiovascular: Negative.  Negative for chest pain.  Gastrointestinal: Negative.  Negative for nausea and abdominal pain.  Genitourinary: Negative.  Negative for dysuria.  Musculoskeletal: Negative for back pain.  Neurological: Negative for headaches.  All other systems reviewed and are negative.    Allergies  Review of patient's allergies indicates no known  allergies.  Home Medications   Current Outpatient Rx  Name  Route  Sig  Dispense  Refill  . hydrochlorothiazide (MICROZIDE) 12.5 MG capsule   Oral   Take 1 capsule (12.5 mg total) by mouth daily.   30 capsule   0   . lisinopril (PRINIVIL,ZESTRIL) 20 MG tablet   Oral   Take 1 tablet (20 mg total) by mouth daily.   30 tablet   0   . EXPIRED: lovastatin (MEVACOR) 20 MG tablet   Oral   Take 1 tablet (20 mg total) by mouth at bedtime.   30 tablet   6   . EXPIRED: spironolactone (ALDACTONE) 25 MG tablet   Oral   Take 1 tablet (25 mg total) by mouth daily.   30 tablet   6    BP 160/75  Pulse 63  Temp(Src) 97.7 F (36.5 C) (Oral)  Resp 20  SpO2 96% Physical Exam  Nursing note and vitals reviewed. Constitutional: He is oriented to person, place, and time. He appears well-developed and well-nourished. No distress.  obese  HENT:  Head: Normocephalic and atraumatic.  Eyes: Pupils are equal, round, and reactive to light.  Neck: Neck supple.  Cardiovascular: Normal rate, regular rhythm and normal heart sounds.   No murmur heard. Pulmonary/Chest: Effort normal and breath sounds normal. No respiratory distress.  Abdominal: Soft. Bowel sounds are normal. There is no tenderness.  Musculoskeletal: He exhibits no edema.  Lymphadenopathy:    He has no cervical adenopathy.  Neurological: He is  alert and oriented to person, place, and time.  Skin: Skin is warm and dry.  Psychiatric: He has a normal mood and affect.    ED Course  Procedures (including critical care time) Labs Review Labs Reviewed  BASIC METABOLIC PANEL  POCT I-STAT TROPONIN I   Imaging Review No results found.  EKG Interpretation   None      EKG independently reviewed by myself: Normal sinus rhythm with a rate of 60, T wave inversions in lead 3 which are unchanged from prior J-point elevation in V2 also unchanged from prior  MDM   1. Hypertension    Patient presents with isolated high blood  pressure. He is nontoxic-appearing and has no physical complaints. Initial blood pressure in triage noted to be 200s/100s.  On my initial evaluation systolics in the 170s. Patient is asymptomatic. He denies any chest pain, shortness breath, headache. At this time he has no evidence of hypertensive urgency or emergency. EKG is unchanged from prior and without evidence of ischemia. Screening troponin is negative. BMP to evaluate for renal function and electrolyte abnormalities is within normal limits. I will restart the patient on his HCTZ and lisinopril. He was given referral to [facility] and  primary care. He was encouraged to establish primary care for further management of his blood pressure. He was given strict return precautions.  After history, exam, and medical workup I feel the patient has been appropriately medically screened and is safe for discharge home. Pertinent diagnoses were discussed with the patient. Patient was given return precautions.     Shon Baton, MD 04/26/13 [phone]

## 2013-04-26 NOTE — ED Notes (Signed)
Pt was sent from urgent care for hypertension and ekg changes. Pt went to urgent care because he had had high bp and has been his meds for"awhile".

## 2013-11-14 ENCOUNTER — Emergency Department (HOSPITAL_COMMUNITY): Payer: BC Managed Care – PPO

## 2013-11-14 ENCOUNTER — Encounter (HOSPITAL_COMMUNITY): Payer: Self-pay | Admitting: Emergency Medicine

## 2013-11-14 ENCOUNTER — Emergency Department (HOSPITAL_COMMUNITY)
Admission: EM | Admit: 2013-11-14 | Discharge: 2013-11-14 | Disposition: A | Discharge status: Home / Self Care | Payer: BC Managed Care – PPO | Attending: Emergency Medicine | Admitting: Emergency Medicine

## 2013-11-14 DIAGNOSIS — I10 Essential (primary) hypertension: Secondary | ICD-10-CM | POA: Insufficient documentation

## 2013-11-14 DIAGNOSIS — E785 Hyperlipidemia, unspecified: Secondary | ICD-10-CM | POA: Insufficient documentation

## 2013-11-14 DIAGNOSIS — Z79899 Other long term (current) drug therapy: Secondary | ICD-10-CM | POA: Insufficient documentation

## 2013-11-14 DIAGNOSIS — F17200 Nicotine dependence, unspecified, uncomplicated: Secondary | ICD-10-CM | POA: Insufficient documentation

## 2013-11-14 DIAGNOSIS — G44219 Episodic tension-type headache, not intractable: Secondary | ICD-10-CM | POA: Insufficient documentation

## 2013-11-14 LAB — BASIC METABOLIC PANEL
ANION GAP: 10 (ref 5–15)
BUN: 11 mg/dL (ref 6–23)
CALCIUM: 9 mg/dL (ref 8.4–10.5)
CO2: 27 meq/L (ref 19–32)
CREATININE: 0.88 mg/dL (ref 0.50–1.35)
Chloride: 105 mEq/L (ref 96–112)
GFR calc Af Amer: >90 mL/min (ref >90)
GFR calc non Af Amer: >90 mL/min (ref >90)
Glucose, Bld: 97 mg/dL (ref 70–99)
Potassium: 4.2 mEq/L (ref 3.7–5.3)
Sodium: 142 mEq/L (ref 137–147)

## 2013-11-14 LAB — I-STAT TROPONIN, ED: Troponin i, poc: 0 ng/mL (ref 0.00–0.08)

## 2013-11-14 LAB — CBC
HCT: 42.5 % (ref 39.0–52.0)
Hemoglobin: 14.2 g/dL (ref 13.0–17.0)
MCH: 30.9 pg (ref 26.0–34.0)
MCHC: 33.4 g/dL (ref 30.0–36.0)
MCV: 92.4 fL (ref 78.0–100.0)
PLATELETS: 312 10*3/uL (ref 150–400)
RBC: 4.6 MIL/uL (ref 4.22–5.81)
RDW: 15.5 % (ref 11.5–15.5)
WBC: 8.5 10*3/uL (ref 4.0–10.5)

## 2013-11-14 MED ORDER — LISINOPRIL 10 MG PO TABS: 10.0000 mg | ORAL_TABLET | Freq: Every day | ORAL | Status: DC

## 2013-11-14 MED ORDER — ASPIRIN 81 MG PO CHEW
324.0000 mg | CHEWABLE_TABLET | Freq: Once | ORAL | Status: AC
  Administered 2013-11-14: 324 mg via ORAL
  Filled 2013-11-14: qty 4

## 2013-11-14 MED ORDER — KETOROLAC TROMETHAMINE 30 MG/ML IJ SOLN
30.0000 mg | Freq: Once | INTRAMUSCULAR | Status: AC
  Administered 2013-11-14: 30 mg via INTRAVENOUS
  Filled 2013-11-14: qty 1

## 2013-11-14 MED ORDER — LISINOPRIL 10 MG PO TABS
10.0000 mg | ORAL_TABLET | Freq: Once | ORAL | Status: AC
  Administered 2013-11-14: 10 mg via ORAL
  Filled 2013-11-14: qty 1

## 2013-11-14 MED ORDER — HYDROCHLOROTHIAZIDE 25 MG PO TABS
25.0000 mg | ORAL_TABLET | Freq: Every day | ORAL | Status: DC
  Administered 2013-11-14: 25 mg via ORAL
  Filled 2013-11-14: qty 1

## 2013-11-14 MED ORDER — HYDROCHLOROTHIAZIDE 25 MG PO TABS: 25.0000 mg | ORAL_TABLET | Freq: Every day | ORAL | Status: DC

## 2013-11-14 NOTE — Discharge Instructions (Signed)
You are having a headache. No specific cause was found today for your headache. It may have been a migraine or other cause of headache. Stress, anxiety, fatigue, and depression are common triggers for headaches. Your headache today does not appear to be life-threatening or require hospitalization, but often the exact cause of headaches is not determined in the emergency department. Therefore, follow-up with your doctor is very important to find out what may have caused your headache, and whether or not you need any further diagnostic testing or treatment. Sometimes headaches can appear benign (not harmful), but then more serious symptoms can develop which should prompt an immediate re-evaluation by your doctor or the emergency department. SEEK MEDICAL ATTENTION IF: You develop possible problems with medications prescribed.  The medications don't resolve your headache, if it recurs , or if you have multiple episodes of vomiting or can't take fluids. You have a change from the usual headache. RETURN IMMEDIATELY IF you develop a sudden, severe headache or confusion, become poorly responsive or faint, develop a fever above 100.11F or problem breathing, have a change in speech, vision, swallowing, or understanding, or develop new weakness, numbness, tingling, incoordination, or have a seizure.    Hypertension Hypertension, commonly called high blood pressure, is when the force of blood pumping through your arteries is too strong. Your arteries are the blood vessels that carry blood from your heart throughout your body. A blood pressure reading consists of a higher number over a lower number, such as 110/72. The higher number (systolic) is the pressure inside your arteries when your heart pumps. The lower number (diastolic) is the pressure inside your arteries when your heart relaxes. Ideally you want your blood pressure below 120/80. Hypertension forces your heart to work harder to pump blood. Your arteries may  become narrow or stiff. Having hypertension puts you at risk for heart disease, stroke, and other problems.  RISK FACTORS Some risk factors for high blood pressure are controllable. Others are not.  Risk factors you cannot control include:   Race. You may be at higher risk if you are African American.  Age. Risk increases with age.  Gender. Men are at higher risk than women before age 48 years. After age 48, women are at higher risk than men. Risk factors you can control include:  Not getting enough exercise or physical activity.  Being overweight.  Getting too much fat, sugar, calories, or salt in your diet.  Drinking too much alcohol. SIGNS AND SYMPTOMS Hypertension does not usually cause signs or symptoms. Extremely high blood pressure (hypertensive crisis) may cause headache, anxiety, shortness of breath, and nosebleed. DIAGNOSIS  To check if you have hypertension, your health care provider will measure your blood pressure while you are seated, with your arm held at the level of your heart. It should be measured at least twice using the same arm. Certain conditions can cause a difference in blood pressure between your right and left arms. A blood pressure reading that is higher than normal on one occasion does not mean that you need treatment. If one blood pressure reading is high, ask your health care provider about having it checked again. TREATMENT  Treating high blood pressure includes making lifestyle changes and possibly taking medication. Living a healthy lifestyle can help lower high blood pressure. You may need to change some of your habits. Lifestyle changes may include:  Following the DASH diet. This diet is high in fruits, vegetables, and whole grains. It is low in salt, red  meat, and added sugars.  Getting at least 2 1/2 hours of brisk physical activity every week.  Losing weight if necessary.  Not smoking.  Limiting alcoholic beverages.  Learning ways to reduce  stress. If lifestyle changes are not enough to get your blood pressure under control, your health care provider may prescribe medicine. You may need to take more than one. Work closely with your health care provider to understand the risks and benefits. HOME CARE INSTRUCTIONS  Have your blood pressure rechecked as directed by your health care provider.   Only take medicine as directed by your health care provider. Follow the directions carefully. Blood pressure medicines must be taken as prescribed. The medicine does not work as well when you skip doses. Skipping doses also puts you at risk for problems.   Do not smoke.   Monitor your blood pressure at home as directed by your health care provider. SEEK MEDICAL CARE IF:   You think you are having a reaction to medicines taken.  You have recurrent headaches or feel dizzy.  You have swelling in your ankles.  You have trouble with your vision. SEEK IMMEDIATE MEDICAL CARE IF:  You develop a severe headache or confusion.  You have unusual weakness, numbness, or feel faint.  You have severe chest or abdominal pain.  You vomit repeatedly.  You have trouble breathing. MAKE SURE YOU:   Understand these instructions.  Will watch your condition.  Will get help right away if you are not doing well or get worse. Document Released: 05/01/2005 Document Revised: 05/06/2013 Document Reviewed: 02/21/2013 [facility] Patient Information 2015 [place], [place]. This information is not intended to replace advice given to you by your health care provider. Make sure you discuss any questions you have with your health care provider.  Managing Your High Blood Pressure Blood pressure is a measurement of how forceful your blood is pressing against the walls of the arteries. Arteries are muscular tubes within the circulatory system. Blood pressure does not stay the same. Blood pressure rises when you are active, excited, or nervous; and it lowers  during sleep and relaxation. If the numbers measuring your blood pressure stay above normal most of the time, you are at risk for health problems. High blood pressure (hypertension) is a long-term (chronic) condition in which blood pressure is elevated. A blood pressure reading is recorded as two numbers, such as 120 over 80 (or 120/80). The first, higher number is called the systolic pressure. It is a measure of the pressure in your arteries as the heart beats. The second, lower number is called the diastolic pressure. It is a measure of the pressure in your arteries as the heart relaxes between beats.  Keeping your blood pressure in a normal range is important to your overall health and prevention of health problems, such as heart disease and stroke. When your blood pressure is uncontrolled, your heart has to work harder than normal. High blood pressure is a very common condition in adults because blood pressure tends to rise with age. Men and women are equally likely to have hypertension but at different times in life. Before age 80, men are more likely to have hypertension. After 48 years of age, women are more likely to have it. Hypertension is especially common in African Americans. This condition often has no signs or symptoms. The cause of the condition is usually not known. Your caregiver can help you come up with a plan to keep your blood pressure in a normal,  healthy range. BLOOD PRESSURE STAGES Blood pressure is classified into four stages: normal, prehypertension, stage 1, and stage 2. Your blood pressure reading will be used to determine what type of treatment, if any, is necessary. Appropriate treatment options are tied to these four stages:  Normal  Systolic pressure (mm Hg): below 120.  Diastolic pressure (mm Hg): below 80. Prehypertension  Systolic pressure (mm Hg): 120 to 139.  Diastolic pressure (mm Hg): 80 to 89. Stage1  Systolic pressure (mm Hg): 140 to 159.  Diastolic  pressure (mm Hg): 90 to 99. Stage2  Systolic pressure (mm Hg): 160 or above.  Diastolic pressure (mm Hg): 100 or above. RISKS RELATED TO HIGH BLOOD PRESSURE Managing your blood pressure is an important responsibility. Uncontrolled high blood pressure can lead to:  A heart attack.  A stroke.  A weakened blood vessel (aneurysm).  Heart failure.  Kidney damage.  Eye damage.  Metabolic syndrome.  Memory and concentration problems. HOW TO MANAGE YOUR BLOOD PRESSURE Blood pressure can be managed effectively with lifestyle changes and medicines (if needed). Your caregiver will help you come up with a plan to bring your blood pressure within a normal range. Your plan should include the following: Education  Read all information provided by your caregivers about how to control blood pressure.  Educate yourself on the latest guidelines and treatment recommendations. New research is always being done to further define the risks and treatments for high blood pressure. Lifestylechanges  Control your weight.  Avoid smoking.  Stay physically active.  Reduce the amount of salt in your diet.  Reduce stress.  Control any chronic conditions, such as high cholesterol or diabetes.  Reduce your alcohol intake. Medicines  Several medicines (antihypertensive medicines) are available, if needed, to bring blood pressure within a normal range. Communication  Review all the medicines you take with your caregiver because there may be side effects or interactions.  Talk with your caregiver about your diet, exercise habits, and other lifestyle factors that may be contributing to high blood pressure.  See your caregiver regularly. Your caregiver can help you create and adjust your plan for managing high blood pressure. RECOMMENDATIONS FOR TREATMENT AND FOLLOW-UP  The following recommendations are based on current guidelines for managing high blood pressure in nonpregnant adults. Use these  recommendations to identify the proper follow-up period or treatment option based on your blood pressure reading. You can discuss these options with your caregiver.  Systolic pressure of 120 to 139 or diastolic pressure of 80 to 89: Follow up with your caregiver as directed.  Systolic pressure of 140 to 160 or diastolic pressure of 90 to 100: Follow up with your caregiver within 2 months.  Systolic pressure above 160 or diastolic pressure above 100: Follow up with your caregiver within 1 month.  Systolic pressure above 180 or diastolic pressure above 110: Consider antihypertensive therapy; follow up with your caregiver within 1 week.  Systolic pressure above 200 or diastolic pressure above 120: Begin antihypertensive therapy; follow up with your caregiver within 1 week. Document Released: 01/24/2012 Document Reviewed: 01/24/2012 [facility] Patient Information 2015 [place], [place]. This information is not intended to replace advice given to you by your health care provider. Make sure you discuss any questions you have with your health care provider.

## 2013-11-14 NOTE — ED Notes (Signed)
Pt daughter is in facility, has a ride home

## 2013-11-14 NOTE — ED Notes (Signed)
Patient transported to CT 

## 2013-11-14 NOTE — ED Provider Notes (Signed)
CSN: [id number]     Arrival date & time 11/14/13  1758 History   First MD Initiated Contact with Patient 11/14/13 1758     Chief Complaint  Patient presents with  . Headache     (Consider location/radiation/quality/duration/timing/severity/associated sxs/prior Treatment) HPI] Charles Bush is a 48 year old male with a past medical history hypertension, hyperlipidemia. Previous drug and alcohol abuse. The patient comes to the emergency department today with chief complaint of headache and hypertension. The patient states that he has had a, progressively worsening headache. He had some associated feeling of disequilibrium. Denies any visual changes, chest pain, shortness of breath or vertigo. The patient states he has not been taking any of his hypertension medications for some time as he cannot afford them. The patient states that today he tried resting, took a Goody's powder however his headache remained even with those interventions. He states that he occasionally gets headaches but not like this. Denies photophobia, phonophobia, UL throbbing, N/V, visual changes, stiff neck, neck pain, rash, or "thunderclap" onset.    Past Medical History  Diagnosis Date  . Morbid obesity   . HTN (hypertension)   . HLD (hyperlipidemia)    Past Surgical History  Procedure Laterality Date  . Mastectomy     Family History  Problem Relation Age of Onset  . Asthma Mother   . Heart attack Mother   . Cancer Father     blood   History  Substance Use Topics  . Smoking status: Current Every Day Smoker -- 1.00 packs/day    Types: Cigarettes  . Smokeless tobacco: Not on file  . Alcohol Use: Yes    Review of Systems  Ten systems reviewed and are negative for acute change, except as noted in the HPI.    Allergies  Review of patient's allergies indicates no known allergies.  Home Medications   Prior to Admission medications   Medication Sig Start Date End Date Taking? Authorizing Provider  lovastatin  (MEVACOR) 20 MG tablet Take 1 tablet (20 mg total) by mouth at bedtime. 04/02/12 04/02/13  Dolores Pattyaniel R Bensimhon, MD  spironolactone (ALDACTONE) 25 MG tablet Take 1 tablet (25 mg total) by mouth daily. 08/23/10 08/23/11  Bevelyn Bucklesaniel R Bensimhon, MD   BP 207/107  Pulse 65  Temp(Src) 98 F (36.7 C) (Oral)  Resp 21  SpO2 99% Physical Exam  Nursing note and vitals reviewed. Constitutional: He appears well-developed and well-nourished. No distress.  HENT:  Head: Normocephalic and atraumatic.  Eyes: Conjunctivae normal are normal. No scleral icterus.  Neck: Normal range of motion. Neck supple.  Cardiovascular: Normal rate, regular rhythm and normal heart sounds.   Pulmonary/Chest: Effort normal and breath sounds normal. No respiratory distress.  Abdominal: Soft. There is no tenderness.  Musculoskeletal: He exhibits no edema.  Neurological: Speech is clear and goal oriented, follows commands Major Cranial nerves without deficit, no facial droop Normal strength in upper and lower extremities bilaterally including dorsiflexion and plantar flexion, strong and equal grip strength Sensation normal to light and sharp touch Moves extremities without ataxia, coordination intact Normal finger to nose and rapid alternating movements Neg romberg, no pronator drift Normal gait Normal heel-shin and balance Skin: Skin is warm and dry. He is not diaphoretic.  Psychiatric: His behavior is normal.    ED Course  Procedures (including critical care time) Labs Review Labs Reviewed  CBC  BASIC METABOLIC PANEL  Rosezena SensorI-STAT TROPOININ, ED    Imaging Review Dg Chest 2 View  11/14/2013   CLINICAL DATA:  One  day history of shortness of breath. Smoker with current history of hypertension.  EXAM: CHEST  2 VIEW  COMPARISON:  08/12/2011, 06/13/2005.  FINDINGS: Cardiac silhouette mildly enlarged but stable since 2013. Hilar and mediastinal contours otherwise unremarkable. Lungs clear. Bronchovascular markings normal.  Pulmonary vascularity normal. No visible pleural effusions. No pneumothorax. Visualized bony thorax intact.  IMPRESSION: Mild cardiomegaly, stable since 2013. No acute cardiopulmonary disease.   Electronically Signed   By: Hulan Saashomas  Lawrence M.D.   On: 11/14/2013 18:52   Ct Head Wo Contrast  11/14/2013   CLINICAL DATA:  Headache  EXAM: CT HEAD WITHOUT CONTRAST  TECHNIQUE: Contiguous axial images were obtained from the base of the skull through the vertex without intravenous contrast.  COMPARISON:  08/12/2011  FINDINGS: The brain has a normal appearance without evidence of atrophy, infarction, mass lesion, hemorrhage, hydrocephalus or extra-axial collection. The calvarium is unremarkable. The paranasal sinuses, middle ears and mastoids are clear.  IMPRESSION: Normal head CT   Electronically Signed   By: Paulina FusiMark  Shogry M.D.   On: 11/14/2013 19:24     EKG Interpretation   Date/Time:  Friday November 14 2013 18:07:48 EDT Ventricular Rate:  62 PR Interval:  144 QRS Duration: 89 QT Interval:  416 QTC Calculation: 422 R Axis:   46 Text Interpretation:  Age not entered, assumed to be  48 years old for  purpose of ECG interpretation Sinus rhythm ST elevations I, AVL similar to  2014 ST depressions inferiorly similar to 2014 Confirmed by GOLDSTON  MD,  SCOTT ([id number]) on 11/14/2013 6:16:50 PM      MDM   Final diagnoses:  Episodic tension-type headache, not intractable  Essential hypertension     .Marland Kitchen.9:53 PM BP 184/90  Pulse 67  Temp(Src) 98 F (36.7 C) (Oral)  Resp 18  SpO2 98% Patient blood pressure trending down. He has no signs of hypertensive urgency or emergency. Pt HA treated and improved while in ED.  Presentation is like pts typical HA and non concerning for [facility], ICH, Meningitis, or temporal arteritis. Pt is afebrile with no focal neuro deficits, nuchal rigidity, or change in vision.. I will ask case management [facility]. D/c with antihypertensive  medicaitons  Pt verbalizes understanding and is agreeable with plan to dc.     Arthor CaptainAbigail Khup Sapia, PA-C 11/15/13 1353

## 2013-11-14 NOTE — ED Notes (Signed)
Per EMS pt woke with headache since 8am. Pt has had a constant headache along with blurred vision. Pt has taken Goody's powder, unrelieved. NAD noted. No generalized weakness, denies LOC. BP 210/102, P 70. Pt does not take meds because he can't afford.  20G IV placed, no meds given.

## 2013-11-14 NOTE — ED Notes (Signed)
Abby, PA at bedside.  

## 2013-11-16 ENCOUNTER — Telehealth: Payer: Self-pay | Admitting: Surgery

## 2013-11-16 NOTE — Telephone Encounter (Signed)
Message copied by Michel BickersOGERS, Yukari Flax on Sun Nov 16, 2013 12:38 PM ------      Message from: Arthor CaptainHARRIS, ABIGAIL      Created: Sat Nov 15, 2013  1:54 PM       Hello Burna MortimerWanda,      This gentleman came in with terrible hypertension. No pcp/ uninsured.      Can we get him some follow up?      -abi ------

## 2013-11-16 NOTE — Telephone Encounter (Signed)
11/16/13 12:38 W. Aundria RudRogers RN   BSN [phone] ED CM received a return call from patient regarding establishing follow up care.  [facility].  Reiterated the address and phone number. Pt verbalized understanding teach back done. ED CM will also contact clinic regarding appt.   11/16/13 13:33 Beecher McardleW. Vannary Greening RN   BSN [phone] ED CM attempted to contact patient by phone regarding establishing care with [facility], LVM with Clinic information regarding establishing appt. ED CM will make 2nd attempt to contact patient next week.

## 2013-11-17 NOTE — ED Provider Notes (Signed)
Medical screening examination/treatment/procedure(s) were conducted as a shared visit with non-physician practitioner(s) and myself.  I personally evaluated the patient during the encounter.   EKG Interpretation   Date/Time:  Friday November 14 2013 18:07:48 EDT Ventricular Rate:  62 PR Interval:  144 QRS Duration: 89 QT Interval:  416 QTC Calculation: 422 R Axis:   46 Text Interpretation:  Age not entered, assumed to be  48 years old for  purpose of ECG interpretation Sinus rhythm ST elevations I, AVL similar to  2014 ST depressions inferiorly similar to 2014 Confirmed by Imre Vecchione  MD,  Lucetta Baehr ([id number]) on 11/14/2013 6:16:50 PM       Patient with headache and HTN. Headache improved in ED. Normal exam. No concerns for hypertensive emergency. Will restart on BP meds, and will need f/u with PCP.  Audree CamelScott T Keiland Pickering, MD 11/17/13 [phone]

## 2014-10-05 ENCOUNTER — Emergency Department (INDEPENDENT_AMBULATORY_CARE_PROVIDER_SITE_OTHER)
Admission: EM | Admit: 2014-10-05 | Discharge: 2014-10-05 | Disposition: A | Discharge status: Home / Self Care | Payer: BC Managed Care – PPO | Source: Home / Self Care | Attending: Family Medicine | Admitting: Family Medicine

## 2014-10-05 ENCOUNTER — Encounter (HOSPITAL_COMMUNITY): Payer: Self-pay | Admitting: Emergency Medicine

## 2014-10-05 ENCOUNTER — Other Ambulatory Visit (HOSPITAL_COMMUNITY)
Admission: RE | Admit: 2014-10-05 | Discharge: 2014-10-05 | Disposition: A | Discharge status: Home / Self Care | Payer: BC Managed Care – PPO | Source: Ambulatory Visit | Attending: Family Medicine | Admitting: Family Medicine

## 2014-10-05 DIAGNOSIS — I10 Essential (primary) hypertension: Secondary | ICD-10-CM | POA: Diagnosis not present

## 2014-10-05 DIAGNOSIS — R319 Hematuria, unspecified: Secondary | ICD-10-CM

## 2014-10-05 DIAGNOSIS — Z113 Encounter for screening for infections with a predominantly sexual mode of transmission: Secondary | ICD-10-CM | POA: Insufficient documentation

## 2014-10-05 LAB — URINALYSIS, ROUTINE W REFLEX MICROSCOPIC
Bilirubin Urine: NEGATIVE
Glucose, UA: NEGATIVE mg/dL
Ketones, ur: NEGATIVE mg/dL
NITRITE: NEGATIVE
PH: 7.5 (ref 5.0–8.0)
Protein, ur: NEGATIVE mg/dL
Specific Gravity, Urine: 1.019 (ref 1.005–1.030)
Urobilinogen, UA: 1 mg/dL (ref 0.0–1.0)

## 2014-10-05 LAB — POCT URINALYSIS DIP (DEVICE)
Bilirubin Urine: NEGATIVE
Glucose, UA: NEGATIVE mg/dL
KETONES UR: NEGATIVE mg/dL
Nitrite: NEGATIVE
Protein, ur: 30 mg/dL — AB
Specific Gravity, Urine: 1.02 (ref 1.005–1.030)
UROBILINOGEN UA: 1 mg/dL (ref 0.0–1.0)
pH: 7.5 (ref 5.0–8.0)

## 2014-10-05 LAB — POCT I-STAT, CHEM 8
BUN: 10 mg/dL (ref 6–20)
CHLORIDE: 100 mmol/L — AB (ref 101–111)
Calcium, Ion: 1.14 mmol/L (ref 1.12–1.23)
Creatinine, Ser: 1 mg/dL (ref 0.61–1.24)
GLUCOSE: 104 mg/dL — AB (ref 65–99)
HCT: 50 % (ref 39.0–52.0)
Hemoglobin: 17 g/dL (ref 13.0–17.0)
Potassium: 3.7 mmol/L (ref 3.5–5.1)
SODIUM: 139 mmol/L (ref 135–145)
TCO2: 26 mmol/L (ref 0–100)

## 2014-10-05 LAB — URINE MICROSCOPIC-ADD ON

## 2014-10-05 MED ORDER — LISINOPRIL-HYDROCHLOROTHIAZIDE 20-12.5 MG PO TABS: 1.0000 | ORAL_TABLET | Freq: Every day | ORAL | Status: DC

## 2014-10-05 NOTE — ED Notes (Signed)
Reports he noticed blood in his urine yest See physicians note Alert, no signs of acute distress.

## 2014-10-05 NOTE — Discharge Instructions (Signed)
Thank you for coming in today. Call or go to the emergency room if you get worse, have trouble breathing, have chest pains, or palpitations.  Follow up with the urology doctors and the primary care doctors for your blood pressure.  Call or go to the emergency room if you get worse, have trouble breathing, have chest pains, or palpitations.   If your belly pain worsens, or you have high fever, bad vomiting, blood in your stool or black tarry stool go to the Emergency Room.    Hematuria Hematuria is blood in your urine. It can be caused by a bladder infection, kidney infection, prostate infection, kidney stone, or cancer of your urinary tract. Infections can usually be treated with medicine, and a kidney stone usually will pass through your urine. If neither of these is the cause of your hematuria, further workup to find out the reason may be needed. It is very important that you tell your health care provider about any blood you see in your urine, even if the blood stops without treatment or happens without causing pain. Blood in your urine that happens and then stops and then happens again can be a symptom of a very serious condition. Also, pain is not a symptom in the initial stages of many urinary cancers. HOME CARE INSTRUCTIONS   Drink lots of fluid, 3-4 quarts a day. If you have been diagnosed with an infection, cranberry juice is especially recommended, in addition to large amounts of water.  Avoid caffeine, tea, and carbonated beverages because they tend to irritate the bladder.  Avoid alcohol because it may irritate the prostate.  Take all medicines as directed by your health care provider.  If you were prescribed an antibiotic medicine, finish it all even if you start to feel better.  If you have been diagnosed with a kidney stone, follow your health care provider's instructions regarding straining your urine to catch the stone.  Empty your bladder often. Avoid holding urine for long  periods of time.  After a bowel movement, women should cleanse front to back. Use each tissue only once.  Empty your bladder before and after sexual intercourse if you are a male. SEEK MEDICAL CARE IF:  You develop back pain.  You have a fever.  You have a feeling of sickness in your stomach (nausea) or vomiting.  Your symptoms are not better in 3 days. Return sooner if you are getting worse. SEEK IMMEDIATE MEDICAL CARE IF:   You develop severe vomiting and are unable to keep the medicine down.  You develop severe back or abdominal pain despite taking your medicines.  You begin passing a large amount of blood or clots in your urine.  You feel extremely weak or faint, or you pass out. MAKE SURE YOU:   Understand these instructions.  Will watch your condition.  Will get help right away if you are not doing well or get worse. Document Released: 05/01/2005 Document Revised: 09/15/2013 Document Reviewed: 12/30/2012 [facility] Patient Information 2015 [place], [place]. This information is not intended to replace advice given to you by your health care provider. Make sure you discuss any questions you have with your health care provider.  Hypertension Hypertension, commonly called high blood pressure, is when the force of blood pumping through your arteries is too strong. Your arteries are the blood vessels that carry blood from your heart throughout your body. A blood pressure reading consists of a higher number over a lower number, such as 110/72. The higher  number (systolic) is the pressure inside your arteries when your heart pumps. The lower number (diastolic) is the pressure inside your arteries when your heart relaxes. Ideally you want your blood pressure below 120/80. Hypertension forces your heart to work harder to pump blood. Your arteries may become narrow or stiff. Having hypertension puts you at risk for heart disease, stroke, and other problems.  RISK FACTORS Some risk  factors for high blood pressure are controllable. Others are not.  Risk factors you cannot control include:   Race. You may be at higher risk if you are African American.  Age. Risk increases with age.  Gender. Men are at higher risk than women before age 53 years. After age 40, women are at higher risk than men. Risk factors you can control include:  Not getting enough exercise or physical activity.  Being overweight.  Getting too much fat, sugar, calories, or salt in your diet.  Drinking too much alcohol. SIGNS AND SYMPTOMS Hypertension does not usually cause signs or symptoms. Extremely high blood pressure (hypertensive crisis) may cause headache, anxiety, shortness of breath, and nosebleed. DIAGNOSIS  To check if you have hypertension, your health care provider will measure your blood pressure while you are seated, with your arm held at the level of your heart. It should be measured at least twice using the same arm. Certain conditions can cause a difference in blood pressure between your right and left arms. A blood pressure reading that is higher than normal on one occasion does not mean that you need treatment. If one blood pressure reading is high, ask your health care provider about having it checked again. TREATMENT  Treating high blood pressure includes making lifestyle changes and possibly taking medicine. Living a healthy lifestyle can help lower high blood pressure. You may need to change some of your habits. Lifestyle changes may include:  Following the DASH diet. This diet is high in fruits, vegetables, and whole grains. It is low in salt, red meat, and added sugars.  Getting at least 2 hours of brisk physical activity every week.  Losing weight if necessary.  Not smoking.  Limiting alcoholic beverages.  Learning ways to reduce stress. If lifestyle changes are not enough to get your blood pressure under control, your health care provider may prescribe medicine.  You may need to take more than one. Work closely with your health care provider to understand the risks and benefits. HOME CARE INSTRUCTIONS  Have your blood pressure rechecked as directed by your health care provider.   Take medicines only as directed by your health care provider. Follow the directions carefully. Blood pressure medicines must be taken as prescribed. The medicine does not work as well when you skip doses. Skipping doses also puts you at risk for problems.   Do not smoke.   Monitor your blood pressure at home as directed by your health care provider. SEEK MEDICAL CARE IF:   You think you are having a reaction to medicines taken.  You have recurrent headaches or feel dizzy.  You have swelling in your ankles.  You have trouble with your vision. SEEK IMMEDIATE MEDICAL CARE IF:  You develop a severe headache or confusion.  You have unusual weakness, numbness, or feel faint.  You have severe chest or abdominal pain.  You vomit repeatedly.  You have trouble breathing. MAKE SURE YOU:   Understand these instructions.  Will watch your condition.  Will get help right away if you are not doing well or  get worse. Document Released: 05/01/2005 Document Revised: 09/15/2013 Document Reviewed: 02/21/2013 [facility] Patient Information 2015 [place]. This information is not intended to replace advice given to you by your health care provider. Make sure you discuss any questions you have with your health care provider.

## 2014-10-05 NOTE — ED Provider Notes (Signed)
Charles Bush is a 49 y.o. male who presents to Urgent Care today for bloody penile discharge present starting yesterday. No fevers or chills nausea vomiting or diarrhea. No chest pains palpitations or shortness of breath. He has not tried any treatment yet. He denies any visible blood in his urine. He does not take his blood pressure medicines because he is having trouble affording them.   Past Medical History  Diagnosis Date  . Morbid obesity   . HTN (hypertension)   . HLD (hyperlipidemia)    Past Surgical History  Procedure Laterality Date  . Mastectomy     History  Substance Use Topics  . Smoking status: Current Every Day Smoker -- 1.00 packs/day    Types: Cigarettes  . Smokeless tobacco: Not on file  . Alcohol Use: Yes   ROS as above Medications: No current facility-administered medications for this encounter.   Current Outpatient Prescriptions  Medication Sig Dispense Refill  . lisinopril-hydrochlorothiazide (ZESTORETIC) 20-12.5 MG per tablet Take 1 tablet by mouth daily. 30 tablet 0  . lovastatin (MEVACOR) 20 MG tablet Take 1 tablet (20 mg total) by mouth at bedtime. 30 tablet 6  . [DISCONTINUED] spironolactone (ALDACTONE) 25 MG tablet Take 1 tablet (25 mg total) by mouth daily. 30 tablet 6   No Known Allergies   Exam:  BP 177/122 mmHg  Pulse 77  Temp(Src) 98.5 F (36.9 C) (Oral)  Resp 18  SpO2 100% Gen: Well NAD HEENT: EOMI,  MMM Lungs: Normal work of breathing. CTABL Heart: RRR no MRG Abd: NABS, Soft. Nondistended, Nontender Exts: Brisk capillary refill, warm and well perfused.  Genitals: No inguinal lymphadenopathy. Testicles are descended bilaterally and nontender without mass. Penis is circumcised and normal appearing without obvious discharge.  Results for orders placed or performed during the hospital encounter of 10/05/14 (from the past 24 hour(s))  POCT urinalysis dip (device)     Status: Abnormal   Collection Time: 10/05/14 10:05 AM  Result Value  Ref Range   Glucose, UA NEGATIVE NEGATIVE mg/dL   Bilirubin Urine NEGATIVE NEGATIVE   Ketones, ur NEGATIVE NEGATIVE mg/dL   Specific Gravity, Urine 1.020 1.005 - 1.030   Hgb urine dipstick LARGE (A) NEGATIVE   pH 7.5 5.0 - 8.0   Protein, ur 30 (A) NEGATIVE mg/dL   Urobilinogen, UA 1.0 0.0 - 1.0 mg/dL   Nitrite NEGATIVE NEGATIVE   Leukocytes, UA SMALL (A) NEGATIVE  I-STAT, chem 8     Status: Abnormal   Collection Time: 10/05/14 10:12 AM  Result Value Ref Range   Sodium 139 135 - 145 mmol/L   Potassium 3.7 3.5 - 5.1 mmol/L   Chloride 100 (L) 101 - 111 mmol/L   BUN 10 6 - 20 mg/dL   Creatinine, Ser 0.45 0.61 - 1.24 mg/dL   Glucose, Bld 409 (H) 65 - 99 mg/dL   Calcium, Ion 8.11 9.14 - 1.23 mmol/L   TCO2 26 0 - 100 mmol/L   Hemoglobin 17.0 13.0 - 17.0 g/dL   HCT 78.2 95.6 - 21.3 %   No results found.  Assessment and Plan: 49 y.o. male with  1) hematuria: Urine micro and culture pending. Urine cytology for gonorrhea Chlamydia and trichomonas pending. Refer to urology.  2) hypertension. Consolidate lisinopril and hydrochlorothiazide until 1 combination pill that is $4 [facility]. Discussed economic and off prior to this with this patient. He smokes. I recommend that he quit smoking one day out of the month so that he can afford his blood pressure  medicines. Follow-up with primary care provider.  Discussed warning signs or symptoms. Please see discharge instructions. Patient expresses understanding.     Rodolph BongEvan S Darrin Koman, MD 10/05/14 [phone]

## 2014-10-06 LAB — URINE CULTURE: Special Requests: NORMAL

## 2014-10-06 LAB — URINE CYTOLOGY ANCILLARY ONLY
CHLAMYDIA, DNA PROBE: NEGATIVE
Neisseria Gonorrhea: NEGATIVE
Trichomonas: POSITIVE — AB

## 2014-10-19 ENCOUNTER — Telehealth (HOSPITAL_COMMUNITY): Payer: Self-pay | Admitting: Family Medicine

## 2014-10-19 MED ORDER — METRONIDAZOLE 500 MG PO TABS: 500.0000 mg | ORAL_TABLET | Freq: Two times a day (BID) | ORAL | Status: AC

## 2014-10-19 NOTE — ED Notes (Signed)
[facility] will [facility].  Rodolph BongEvan S Blayn Whetsell, MD 10/19/14 [phone]

## 2014-10-19 NOTE — ED Notes (Signed)
Spoke w patient about his trichomonas infection. Needs to get his Rx and take it as written

## 2014-10-19 NOTE — ED Notes (Signed)
Left message for patient to call us about his Rx called in to [facility] on [address] for flagyl to treat his trichomonas infection

## 2014-12-14 ENCOUNTER — Emergency Department (HOSPITAL_COMMUNITY)
Admission: EM | Admit: 2014-12-14 | Discharge: 2014-12-14 | Disposition: A | Discharge status: Home / Self Care | Payer: BC Managed Care – PPO | Attending: Emergency Medicine | Admitting: Emergency Medicine

## 2014-12-14 ENCOUNTER — Encounter (HOSPITAL_COMMUNITY): Payer: Self-pay | Admitting: Emergency Medicine

## 2014-12-14 DIAGNOSIS — I10 Essential (primary) hypertension: Secondary | ICD-10-CM | POA: Insufficient documentation

## 2014-12-14 DIAGNOSIS — E785 Hyperlipidemia, unspecified: Secondary | ICD-10-CM | POA: Diagnosis not present

## 2014-12-14 DIAGNOSIS — R600 Localized edema: Secondary | ICD-10-CM | POA: Diagnosis present

## 2014-12-14 DIAGNOSIS — H578 Other specified disorders of eye and adnexa: Secondary | ICD-10-CM | POA: Insufficient documentation

## 2014-12-14 DIAGNOSIS — Z792 Long term (current) use of antibiotics: Secondary | ICD-10-CM | POA: Insufficient documentation

## 2014-12-14 DIAGNOSIS — Z87891 Personal history of nicotine dependence: Secondary | ICD-10-CM | POA: Insufficient documentation

## 2014-12-14 DIAGNOSIS — G510 Bell's palsy: Secondary | ICD-10-CM | POA: Diagnosis not present

## 2014-12-14 MED ORDER — TETRACAINE HCL 0.5 % OP SOLN
1.0000 [drp] | Freq: Once | OPHTHALMIC | Status: AC
  Administered 2014-12-14: 1 [drp] via OPHTHALMIC
  Filled 2014-12-14: qty 2

## 2014-12-14 MED ORDER — LISINOPRIL-HYDROCHLOROTHIAZIDE 20-12.5 MG PO TABS: 2.0000 | ORAL_TABLET | Freq: Every day | ORAL | Status: AC

## 2014-12-14 MED ORDER — ACYCLOVIR 400 MG PO TABS: 400.0000 mg | ORAL_TABLET | Freq: Four times a day (QID) | ORAL | Status: AC

## 2014-12-14 MED ORDER — FLUORESCEIN SODIUM 1 MG OP STRP
ORAL_STRIP | OPHTHALMIC | Status: AC
  Filled 2014-12-14: qty 1

## 2014-12-14 NOTE — ED Notes (Signed)
Patient coming from home with c/o of facial swelling, eye drainage, swelling and redness ongoing since yesterday.  LKN without numbness in the face is Saturday.  Dental pain since Thursday to lower back tooth (chipped tooth).  No pain just numb.

## 2014-12-14 NOTE — ED Provider Notes (Signed)
Complains of left-sided facial swelling and left eye draining. 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 Left eye with mild conjunctival erythema. No fluorescein uptake. Moves all extremity as well .other cranial nerves II through XII grossly intact  Doug Sou, MD 12/14/14 1507

## 2014-12-14 NOTE — Discharge Instructions (Signed)
Bell's Palsy Bell's palsy is a condition in which the muscles on one side of the face cannot move (paralysis). This is because the nerves in the face are paralyzed. It is most often thought to be caused by a virus. The virus causes swelling of the nerve that controls movement on one side of the face. The nerve travels through a tight space surrounded by bone. When the nerve swells, it can be compressed by the bone. This results in damage to the protective covering around the nerve. This damage interferes with how the nerve communicates with the muscles of the face. As a result, it can cause weakness or paralysis of the facial muscles.  Injury (trauma), tumor, and surgery may cause Bell's palsy, but most of the time the cause is unknown. It is a relatively common condition. It starts suddenly (abrupt onset) with the paralysis usually ending within 2 days. Bell's palsy is not dangerous. But because the eye does not close properly, you may need care to keep the eye from getting dry. This can include splinting (to keep the eye shut) or moistening with artificial tears. Bell's palsy very seldom occurs on both sides of the face at the same time. SYMPTOMS   Eyebrow sagging.  Drooping of the eyelid and corner of the mouth.  Inability to close one eye.  Loss of taste on the front of the tongue.  Sensitivity to loud noises. TREATMENT  The treatment is usually non-surgical. If the patient is seen within the first 24 to 48 hours, a short course of steroids may be prescribed, in an attempt to shorten the length of the condition. Antiviral medicines may also be used with the steroids, but it is unclear if they are helpful.  You will need to protect your eye, if you cannot close it. The cornea (clear covering over your eye) will become dry and can be damaged. Artificial tears can be used to keep your eye moist. Glasses or an eye patch should be worn to protect your eye. PROGNOSIS  Recovery is variable, ranging  from days to months. Although the problem usually goes away completely (about 80% of cases resolve), predicting the outcome is impossible. Most people improve within 3 weeks of when the symptoms began. Improvement may continue for 3 to 6 months. A small number of people have moderate to severe weakness that is permanent.  HOME CARE INSTRUCTIONS   If your caregiver prescribed medication to reduce swelling in the nerve, use as directed. Do not stop taking the medication unless directed by your caregiver.  Use moisturizing eye drops as needed to prevent drying of your eye, as directed by your caregiver.  Protect your eye, as directed by your caregiver.  Use facial massage and exercises, as directed by your caregiver.  Perform your normal activities, and get your normal rest. SEEK IMMEDIATE MEDICAL CARE IF:   There is pain, redness or irritation in the eye.  You or your child has an oral temperature above 102 F (38.9 C), not controlled by medicine. MAKE SURE YOU:   Understand these instructions.  Will watch your condition.  Will get help right away if you are not doing well or get worse. Document Released: 05/01/2005 Document Revised: 07/24/2011 Document Reviewed: 08/08/2013 [facility] Patient Information 2015 [place], [place]. This information is not intended to replace advice given to you by your health care provider. Make sure you discuss any questions you have with your health care provider.   Please use artifical tears in the eye  during daytime. At night use Lacrilube to keep eye moist. Please monitor for new or worsening signs or symptoms, returning nearly fainted present. Please follow-up with [facility] and wellness for reevaluation of current symptoms and hypertension.

## 2014-12-14 NOTE — ED Provider Notes (Signed)
CSN: [id number]     Arrival date & time 12/14/14  1049 History   First MD Initiated Contact with Patient 12/14/14 1150     Chief Complaint  Patient presents with  . Facial Swelling  . Eye Drainage   HPI   49 year old male presents today with paralysis of the left side of his face and a watery eye. Patient reports symptoms started 3 days ago with numbness of the left side of his face and tongue. Patient notes that that remained with the addition excessive tear production in the left eye. He notes symptoms have not worsened, have not improved. Patient denies any recent trauma to the head or face, infections, exposure to ticks, rashes, history of HSV, any other neurological complaints other than the left sided paralysis and numbness. Patient reports he is able to close his eyes but has reduced strength in the eyelids. He has not seen anyone for this, no previous history of the same.      Past Medical History  Diagnosis Date  . Morbid obesity   . HTN (hypertension)   . HLD (hyperlipidemia)    Past Surgical History  Procedure Laterality Date  . Mastectomy     Family History  Problem Relation Age of Onset  . Asthma Mother   . Heart attack Mother   . Cancer Father     blood   History  Substance Use Topics  . Smoking status: Former Smoker -- 1.00 packs/day    Types: Cigarettes  . Smokeless tobacco: Not on file  . Alcohol Use: Yes    Review of Systems  All other systems reviewed and are negative.   Allergies  Review of patient's allergies indicates no known allergies.  Home Medications   Prior to Admission medications   Medication Sig Start Date End Date Taking? Authorizing Provider  acyclovir (ZOVIRAX) 400 MG tablet Take 1 tablet (400 mg total) by mouth 4 (four) times daily. 12/14/14   Eyvonne Mechanic, PA-C  lisinopril-hydrochlorothiazide (PRINZIDE) 20-12.5 MG per tablet Take 2 tablets by mouth daily. 12/14/14   Eyvonne Mechanic, PA-C  lovastatin (MEVACOR) 20 MG tablet Take 1  tablet (20 mg total) by mouth at bedtime. 04/02/12 04/02/13  Bevelyn Buckles Bensimhon, MD  metroNIDAZOLE (FLAGYL) 500 MG tablet Take 1 tablet (500 mg total) by mouth 2 (two) times daily. 10/19/14   Rodolph Bong, MD   BP 179/89 mmHg  Pulse 64  Temp(Src) 98.2 F (36.8 C) (Oral)  Resp 19  Ht 5\' 7"  (1.702 m)  SpO2 98%   Physical Exam  Constitutional: He is oriented to person, place, and time. He appears well-developed and well-nourished.  HENT:  Head: Normocephalic and atraumatic.  Right Ear: Hearing, tympanic membrane, external ear and ear canal normal.  Left Ear: Hearing, tympanic membrane, external ear and ear canal normal.  Mouth/Throat: Oropharynx is clear and moist and mucous membranes are normal.  Numerous teeth throughout, no signs of infection in the mouth or throat, no appreciable swelling along the gumline. No significant periorbital swelling, mild lid edema on the left  Eyes: EOM are normal. Pupils are equal, round, and reactive to light. Right eye exhibits no discharge. Left eye exhibits no discharge. Right conjunctiva is not injected. Left conjunctiva is injected. Left conjunctiva has no hemorrhage. No scleral icterus. Right eye exhibits normal extraocular motion and no nystagmus. Left eye exhibits normal extraocular motion and no nystagmus.  Patient has injection of the left eye with excessively watery left eye.  Reevaluation: Upon reevaluation approximately  20 minutes after initial patient's eye significantly improved near equal bilateral with decreased watering  Neck: Normal range of motion. No JVD present. No tracheal deviation present.  Pulmonary/Chest: Effort normal. No stridor.  Neurological: He is alert and oriented to person, place, and time. Coordination normal. GCS eye subscore is 4. GCS verbal subscore is 5. GCS motor subscore is 6.  Decreased strength and function of the seventh cranial nerve, patient able to close eyes but not against resistance, decreased forehead  wrinkling. Remainder of neurological evaluation intact. Strength 5 out of 5 in all major extremities ambulatory without difficulty sensation intact other than that noted above  Skin:  No rashes noted  Psychiatric: He has a normal mood and affect. His behavior is normal. Judgment and thought content normal.  Nursing note and vitals reviewed.   ED Course  Procedures (including critical care time) Labs Review Labs Reviewed - No data to display  Imaging Review No results found.   EKG Interpretation None      MDM   Final diagnoses:  Bell's palsy    Labs:  Imaging:  Consults:  Therapeutics:  Discharge Meds:   Assessment/Plan: 49 year old male presents with likely Bell's palsy. Symptoms started with numbness and weakness of the left side of the face, there are no other neurological deficits other than cranial nerve VII. Patient began to develop irritation of the left eye, this is likely noninfectious as patient does not have purulent drainage, crusting of the eyes just excessive watering, likely due to inability to completely close eye at night. Patient only has partial paralysis, this is a mild case of Bell's palsy, symptoms began greater than 72 hours after presentation, no known infectious causes. Fluorescein staining showed no abrasions or uptake that would indicate viral or corneal abrasion. Patient's redness had improved throughout his stay, unlikely bacterial conjunctivitis. 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 Patient is instructed to use artificial tears during the day and Lacri-Lube at night. Patient wears glasses, encouraged to use these as he has decreased ability to adequately react to potential eye foreign bodies. Patient verbalizes understanding and agreement today's plan and had no further  questions or concerns at the time of discharge        Eyvonne Mechanic, PA-C 12/15/14 0750  Doug Sou, MD 12/15/14 [phone]

## 2014-12-17 ENCOUNTER — Telehealth: Payer: Self-pay | Admitting: *Deleted

## 2014-12-17 NOTE — Telephone Encounter (Signed)
Pt called stating he could not afford Rx:  acyclovir (ZOVIRAX) 400 MG tablet.  NCM sent coupon from [employer] to pt cellphone to present at [facility].  Pt very appreciative.
